# Patient Record
Sex: Female | Born: 1960 | Race: White | Hispanic: No | Marital: Married | Smoking: Current every day smoker
Health system: Southern US, Community
[De-identification: ages and names within clinical notes are randomized; demographics above are authoritative.]

## PROBLEM LIST (undated history)

## (undated) DIAGNOSIS — R32 Unspecified urinary incontinence: Secondary | ICD-10-CM

## (undated) DIAGNOSIS — F419 Anxiety disorder, unspecified: Secondary | ICD-10-CM

## (undated) DIAGNOSIS — E7800 Pure hypercholesterolemia, unspecified: Secondary | ICD-10-CM

## (undated) DIAGNOSIS — C801 Malignant (primary) neoplasm, unspecified: Secondary | ICD-10-CM

## (undated) DIAGNOSIS — J449 Chronic obstructive pulmonary disease, unspecified: Secondary | ICD-10-CM

## (undated) HISTORY — DX: Anxiety disorder, unspecified: F41.9

## (undated) HISTORY — PX: ABDOMINAL HYSTERECTOMY: SHX81

## (undated) HISTORY — DX: Unspecified urinary incontinence: R32

---

## 2008-02-14 ENCOUNTER — Emergency Department: Payer: Self-pay | Admitting: Unknown Physician Specialty

## 2011-04-23 ENCOUNTER — Ambulatory Visit: Payer: Self-pay

## 2011-08-28 DIAGNOSIS — J441 Chronic obstructive pulmonary disease with (acute) exacerbation: Secondary | ICD-10-CM | POA: Insufficient documentation

## 2011-08-28 DIAGNOSIS — M1990 Unspecified osteoarthritis, unspecified site: Secondary | ICD-10-CM | POA: Insufficient documentation

## 2011-10-07 DIAGNOSIS — E7800 Pure hypercholesterolemia, unspecified: Secondary | ICD-10-CM | POA: Insufficient documentation

## 2011-10-29 ENCOUNTER — Emergency Department: Payer: Self-pay | Admitting: Emergency Medicine

## 2011-12-10 ENCOUNTER — Ambulatory Visit: Payer: Self-pay | Admitting: Gastroenterology

## 2012-01-01 ENCOUNTER — Ambulatory Visit: Payer: Self-pay | Admitting: Specialist

## 2012-02-25 DIAGNOSIS — F411 Generalized anxiety disorder: Secondary | ICD-10-CM | POA: Insufficient documentation

## 2012-05-09 ENCOUNTER — Inpatient Hospital Stay: Payer: Self-pay | Admitting: Internal Medicine

## 2012-05-09 LAB — CBC
HCT: 47.6 % — ABNORMAL HIGH (ref 35.0–47.0)
HGB: 15.8 g/dL (ref 12.0–16.0)
MCH: 29.6 pg (ref 26.0–34.0)
MCHC: 33.2 g/dL (ref 32.0–36.0)
Platelet: 196 10*3/uL (ref 150–440)
RBC: 5.35 10*6/uL — ABNORMAL HIGH (ref 3.80–5.20)

## 2012-05-09 LAB — URINALYSIS, COMPLETE
Protein: 100
RBC,UR: 4 /HPF (ref 0–5)
WBC UR: 5 /HPF (ref 0–5)

## 2012-05-09 LAB — COMPREHENSIVE METABOLIC PANEL
Alkaline Phosphatase: 98 U/L (ref 50–136)
Anion Gap: 11 (ref 7–16)
BUN: 8 mg/dL (ref 7–18)
Bilirubin,Total: 0.7 mg/dL (ref 0.2–1.0)
Calcium, Total: 9.1 mg/dL (ref 8.5–10.1)
Chloride: 99 mmol/L (ref 98–107)
Co2: 26 mmol/L (ref 21–32)
Creatinine: 0.6 mg/dL (ref 0.60–1.30)
EGFR (African American): >60
Glucose: 128 mg/dL — ABNORMAL HIGH (ref 65–99)
Osmolality: 272 (ref 275–301)
SGOT(AST): 22 U/L (ref 15–37)
SGPT (ALT): 13 U/L (ref 12–78)
Sodium: 136 mmol/L (ref 136–145)
Total Protein: 8.1 g/dL (ref 6.4–8.2)

## 2012-05-09 LAB — MAGNESIUM: Magnesium: 1.9 mg/dL

## 2012-05-10 LAB — BASIC METABOLIC PANEL
Anion Gap: 8 (ref 7–16)
Co2: 30 mmol/L (ref 21–32)
EGFR (African American): >60
Glucose: 138 mg/dL — ABNORMAL HIGH (ref 65–99)
Sodium: 135 mmol/L — ABNORMAL LOW (ref 136–145)

## 2012-05-10 LAB — CBC WITH DIFFERENTIAL/PLATELET
Basophil #: 0 10*3/uL (ref 0.0–0.1)
Basophil %: 0.2 %
Eosinophil #: 0 10*3/uL (ref 0.0–0.7)
Eosinophil %: 0 %
Lymphocyte #: 0.6 10*3/uL — ABNORMAL LOW (ref 1.0–3.6)
Lymphocyte %: 5.4 %
MCH: 30 pg (ref 26.0–34.0)
MCHC: 33.6 g/dL (ref 32.0–36.0)
Monocyte %: 1.7 %
Neutrophil #: 9.5 10*3/uL — ABNORMAL HIGH (ref 1.4–6.5)
Platelet: 187 10*3/uL (ref 150–440)
RDW: 14.5 % (ref 11.5–14.5)
WBC: 10.2 10*3/uL (ref 3.6–11.0)

## 2012-05-13 LAB — EXPECTORATED SPUTUM ASSESSMENT W REFEX TO RESP CULTURE

## 2012-05-14 LAB — CULTURE, BLOOD (SINGLE)

## 2012-10-14 ENCOUNTER — Emergency Department: Payer: Self-pay | Admitting: Emergency Medicine

## 2012-10-14 LAB — CBC
HCT: 43.7 % (ref 35.0–47.0)
HGB: 14.8 g/dL (ref 12.0–16.0)
MCH: 29.9 pg (ref 26.0–34.0)
MCHC: 33.9 g/dL (ref 32.0–36.0)
MCV: 88 fL (ref 80–100)
Platelet: 194 10*3/uL (ref 150–440)

## 2012-10-14 LAB — BASIC METABOLIC PANEL
Anion Gap: 3 — ABNORMAL LOW (ref 7–16)
BUN: 5 mg/dL — ABNORMAL LOW (ref 7–18)
Calcium, Total: 9.2 mg/dL (ref 8.5–10.1)
Creatinine: 0.7 mg/dL (ref 0.60–1.30)
EGFR (African American): >60
EGFR (Non-African Amer.): >60
Glucose: 113 mg/dL — ABNORMAL HIGH (ref 65–99)
Osmolality: 283 (ref 275–301)
Potassium: 3.5 mmol/L (ref 3.5–5.1)

## 2012-11-02 DIAGNOSIS — Z720 Tobacco use: Secondary | ICD-10-CM | POA: Insufficient documentation

## 2012-11-13 ENCOUNTER — Emergency Department: Payer: Self-pay | Admitting: Emergency Medicine

## 2012-11-13 LAB — BASIC METABOLIC PANEL
Anion Gap: 5 — ABNORMAL LOW (ref 7–16)
BUN: 10 mg/dL (ref 7–18)
Calcium, Total: 8.8 mg/dL (ref 8.5–10.1)
Co2: 32 mmol/L (ref 21–32)
Creatinine: 0.8 mg/dL (ref 0.60–1.30)
Glucose: 154 mg/dL — ABNORMAL HIGH (ref 65–99)
Potassium: 4.1 mmol/L (ref 3.5–5.1)

## 2012-11-13 LAB — CBC
HCT: 45.6 % (ref 35.0–47.0)
HGB: 15.4 g/dL (ref 12.0–16.0)
MCH: 30.4 pg (ref 26.0–34.0)
RBC: 5.05 10*6/uL (ref 3.80–5.20)
RDW: 14.3 % (ref 11.5–14.5)
WBC: 18.3 10*3/uL — ABNORMAL HIGH (ref 3.6–11.0)

## 2013-06-14 ENCOUNTER — Ambulatory Visit: Payer: Self-pay | Admitting: Internal Medicine

## 2013-06-27 ENCOUNTER — Ambulatory Visit: Payer: Self-pay | Admitting: Internal Medicine

## 2013-07-17 ENCOUNTER — Ambulatory Visit: Payer: Self-pay | Admitting: Internal Medicine

## 2013-07-21 ENCOUNTER — Ambulatory Visit: Payer: Self-pay | Admitting: Internal Medicine

## 2013-07-31 ENCOUNTER — Ambulatory Visit: Payer: Self-pay | Admitting: Internal Medicine

## 2013-08-14 ENCOUNTER — Ambulatory Visit: Payer: Self-pay | Admitting: Internal Medicine

## 2013-08-17 LAB — BRONCHIAL WASH CULTURE

## 2013-09-04 LAB — CULTURE, FUNGUS WITHOUT SMEAR

## 2013-09-20 ENCOUNTER — Ambulatory Visit: Payer: Self-pay | Admitting: Internal Medicine

## 2013-12-26 ENCOUNTER — Ambulatory Visit: Payer: Self-pay | Admitting: Internal Medicine

## 2014-04-20 ENCOUNTER — Ambulatory Visit: Payer: Self-pay | Admitting: Internal Medicine

## 2014-06-12 ENCOUNTER — Encounter
Admit: 2014-06-12 | Disposition: A | Discharge status: Home / Self Care | Payer: Self-pay | Attending: Internal Medicine | Admitting: Internal Medicine

## 2014-07-13 ENCOUNTER — Encounter
Admit: 2014-07-13 | Disposition: A | Discharge status: Home / Self Care | Payer: Self-pay | Attending: Internal Medicine | Admitting: Internal Medicine

## 2014-07-19 ENCOUNTER — Ambulatory Visit: Admit: 2014-07-19 | Disposition: A | Discharge status: Home / Self Care | Payer: Self-pay | Admitting: Internal Medicine

## 2014-08-03 NOTE — H&P (Signed)
PATIENT NAME:  Patty Coleman, Patty Coleman MR#:  [id number] DATE OF BIRTH:  [birth year]  DATE OF ADMISSION:  05/09/2012  PRIMARY PHYSICIAN:  Dr. Legrand Como Blocker.  REFERRING PHYSICIAN:  Dr. Michel Santee.  CHIEF COMPLAINT: Cough with sputum and shortness of breath.   HISTORY OF PRESENTING ILLNESS: This is a 54 year old female who is an active smoker and has COPD. She has complaint of cough and sputum for the last 1 to 2 days, and she started feeling short of breath since yesterday afternoon. She was feeling very short of breath and went to her primary care physician's office, Dr. Fabio Asa office, and after seeing her, they told her to go to the Emergency Room immediately for getting admitted. On further workup in Emergency Room, she was found having right middle lobe pneumonia on chest x-ray, and she was severely hypoxic. As per the ER record, her oxygen saturation was 66 on room air, and then they started her on oxygen supplementation by NRBM. Her ABG was severely acidotic, and so she was started on BiPAP as a trial, and she had little improvement on BiPAP, so she is continued on BiPAP and given full admission. On further questioning, she gives answers as following:   REVIEW OF SYSTEMS:   CONSTITUTIONAL:  Negative for fever, fatigue. Positive for generalized weakness.  EYES: No blurring or double vision, pain or redness.  EAR, NOSE, THROAT: No tinnitus, ear pain or hearing loss.  RESPIRATORY: Has cough and greenish sputum and has severe shortness of breath.  CARDIOVASCULAR: No chest pain, orthopnea, arrhythmia or palpitations.  GASTROINTESTINAL: No nausea, vomiting, diarrhea or abdominal pain.  GENITOURINARY: No dysuria, hematuria, frequency or incontinence.  MUSCULOSKELETAL: No pain in back, neck or any other joint.  SKIN: No rashes.  NEUROLOGICAL: No numbness, weakness, tremors. Has some headache because of cough and shortness of breath.  PSYCHIATRIC: No anxiety, insomnia or any other illness.   PAST  MEDICAL HISTORY:  COPD diagnosed since 1 year. Does not use any oxygen at home, and is controlled with inhalers. Hyperlipidemia.   PAST SURGICAL HISTORY: Hysterectomy and C-section.   HOME MEDICATIONS:  Advair 1 puff 2 times a day, ProAir 1 puff every 6 hours as needed, lovastatin, does not remember the dose; tramadol for pain as needed, Xanax 0.5 mg 2 times a day.   SOCIAL HISTORY: She is a smoker, smokes 1 pack per day every day, and has desire to cut it down, has been using nicotine patch for the last few days, but she was still smoking. Drinks alcohol very rarely, almost once a year in social gatherings.  Denies any illegal drug use. She works in a school.   FAMILY HISTORY: Positive for CHF in mother and cousin had diabetes.   ALLERGIES: No known drug allergies.   PHYSICAL EXAMINATION: VITAL SIGNS: Temperature 98.9, pulse rate 110, respirations 33, blood pressure 105/59, and pulse ox on presentation 66 on room air, and currently 96 on BiPAP.  GENERAL: She is fully alert, oriented to time, place and person, and in acute respiratory distress due to shortness of breath and being on BiPAP.  HEAD AND NECK:  Atraumatic. Conjunctivae pink. Oral mucosa moist. No JVD. Neck supple.  RESPIRATORY: Bilateral equal air entry, wheezing and creps are bilateral.   CARDIOVASCULAR: S1, S2 present. Tachycardia. No murmur appreciated, due to tachycardia. Nontender on local palpation on chest.  ABDOMEN: Soft, nontender with bowel sounds present. No organomegaly appreciated.  SKIN: No rashes.  EXTREMITIES: No edema on the lower limbs.  JOINTS: No  swelling or tenderness.  NEUROLOGICAL: Moves all 4 limbs. Follows commands. Power 5/5. No tremors.  PSYCHIATRIC: Does not appear in any acute psychiatric illness at this point of time.   LABORATORY RESULTS:  Glucose 128, BUN 8, creatinine 0.6, sodium 136, potassium 3.7, chloride 99, CO2 of 26, calcium 9.1. Total protein 8.1, albumin 3.9, bilirubin 0.7, alkaline phos  98, SGOT 22, SGPT 13. Troponin less than 0.02. WBC 17.6, hemoglobin 15.8, platelet count 196, MCV 89. Influenza A and B negative antigens. ABG on nonrebreather mask: PH is 7.18, pCO2 of 77 and pO2 of 153 on 100% FiO2, and repeated after 1 hour on BiPAP, pH 7.24, pCO2 of 64, and pO2 of  276 on 80% FiO2.   EKG: Sinus tachycardia, and there were a few runs of premature ventricular beats, up to 8 to 10 beats in a row, and then she converted back to sinus rhythm.  Chest x-ray: Atelectasis versus infiltrate in region of the right middle lobe.   ASSESSMENT AND PLAN: A 54 year old female with past medical history of chronic obstructive pulmonary disease, chronic smoker, and hyperlipidemia, presented with severe shortness of breath and cough with sputum production, sent from PMD's office for admission.  1.  Acute on chronic respiratory failure, severe hypoxia secondary to pneumonia and chronic obstructive pulmonary disease exacerbation. Currently had some improvement on BiPAP, so we will continue BiPAP for the next few hours and check for improvement. If she continues to breath this fast and her CO2 levels remain high and she remains acidotic, then she might need to get intubated. I explained to her about this possibility, and she and her husband are in the room. They are okay with that.  2.  Pneumonia.  We will treat her currently with IV Rocephin and IV Zithromax and monitor for improvement.  3.  Chronic obstructive pulmonary disease exacerbation. The cause of this is pneumonia. We are treating pneumonia with antibiotics. We will give IV steroids. We will give nebulizers, Spiriva and she is currently on BiPAP for that.  4.  Hyperlipidemia. Continue lovastatin.  5.  Premature ventricular beats and tachycardia. This is secondary to respiratory distress and pneumonia, and she had 8 to 10 beats in a row, and then she got converted back to sinus tach, so I attribute this to the stress of shortness of breath and  pneumonia, and I will monitor her on telemetry. I do not think she needs to have any additional medication at this point of time, but if she worsens, then we might need to consider that seriously. 6.  Deep vein thrombosis and gastrointestinal prophylaxis.  7.  Current smoker.  We will give nicotine patch while she is in the hospital. Smoking cessation counseling done for 5 minutes, and she is willing to cut it down and stop it.   CODE STATUS: FULL CODE.   Condition and plan discussed in the presence of husband, who is present in the room.  Total Time Spent on this patient with history taking, evaluation and explaining the plan is 1  hour.   Explained to her and her husband about the critical condition and the possibility of worsening, then she might need to be intubated. They understand and agree with the plan.   ____________________________ Ceasar Lund. Anselm Jungling, MD vgv:dm D: 05/09/2012 12:17:12 ET T: 05/09/2012 12:31:46 ET JOB#: [id number]  cc: Ceasar Lund. Anselm Jungling, MD, <Dictator> Vaughan Basta MD ELECTRONICALLY SIGNED 06/03/2012 17:55

## 2014-08-03 NOTE — Discharge Summary (Signed)
PATIENT NAME:  Patty Coleman, Patty Coleman MR#:  [id number] DATE OF BIRTH:  07/24/1960  DATE OF ADMISSION:  05/09/2012 DATE OF DISCHARGE:  05/12/2012  PRIMARY CARE PHYSICIAN: Patty Boast, MD  PRESENTING COMPLAINT: Shortness of breath.   DISCHARGE DIAGNOSES: 1. Acute on chronic hypoxic respiratory failure due to chronic obstructive pulmonary disease flare.  2. Pneumonia.  3. Tobacco abuse.   SATURATIONS: 93% to 94% on 2 liters nasal cannula continuous.   CODE STATUS: FULL CODE.   DISCHARGE MEDICATIONS: 1. Lovastatin 20 mg daily.  2. Advair 100/50 1 puff 2 times a day. 3. Pro Air HFA 1 puff as needed for shortness of breath.  4. Prednisone taper.  5. Tessalon Perles 100 mg q. 4 hours.  6. Xanax 0.5 mg 2 times a day. 7. Albuterol ipratropium/DuoNebs 3 times daily as needed.  8. Levaquin 500 mg daily.  DISCHARGE INSTRUCTIONS: Follow up with Dr. Clayborn Coleman in 1 to 2 weeks. Use your oxygen and nebulizer as instructed.   DISCHARGE LABS/RADIOLOGIC STUDIES: Legionella antigen was negative. Sputum culture no organisms seen. White count is 10.2. Basic metabolic panel within normal limits. PaCO2 on admission was elevated to 68. Urine negative for UTI. Blood cultures negative in 48 hours.  ABG: On admission pH was 7.18, pCO2 77 and pO2 153. This was on nonrebreather. Influenza A and B were negative.   Chest x-ray consistent with atelectasis versus infiltrate in the region of the right middle lobe.   BRIEF SUMMARY OF HOSPITAL COURSE: Ms. Patty Coleman is a 54 year old Caucasian female with long-standing history of tobacco abuse, COPD and chronic smoker who comes in with severe shortness of breath and cough with sputum production. She was admitted with:  1. Acute on chronic hypoxic hypercarbic respiratory failure secondary to right middle lobe pneumonia and chronic obstructive pulmonary disease exacerbation. She was started on BiPAP initially, changed to nasal cannula. Her sats remained stable. She was on  IV Rocephin and Zithromax which was changed to p.o. Levaquin at discharge. She was given IV Solu-Medrol, high doses, Advair and nebulizers and was changed to p.o. steroids at discharge.  2. Pneumonia. The patient will get treatment with broad-spectrum antibiotics for about 7 to 8 days. Blood cultures remained negative.  3. Chronic obstructive pulmonary disease exacerbation. The patient was continued on steroids, nebulizers and Spiriva. The patient did require home oxygen given her sats dropping down into the 80s on exertion on room air.  4. Hyperlipidemia. Lovastatin was continued.  5. Deep vein thrombosis prophylaxis heparin was with sub-Q heparin. The patient was ambulatory as well.  6. Current tobacco abuse. The patient was given nicotine patch. She was counseled on smoking cessation.   Her hospital stay remained stable. The patient remained a FULL CODE.   TIME SPENT: 40 minutes.  ____________________________ Patty Rochester Posey Pronto, MD sap:sb D: 05/13/2012 07:24:42 ET T: 05/13/2012 11:45:41 ET JOB#: [id number]  cc: Patty Klas A. Posey Pronto, MD, <Dictator> Patty Blocker, MD Patty Basset MD ELECTRONICALLY SIGNED 05/20/2012 7:05

## 2014-08-13 ENCOUNTER — Ambulatory Visit: Payer: Self-pay

## 2014-08-15 ENCOUNTER — Ambulatory Visit: Payer: Self-pay

## 2014-08-17 ENCOUNTER — Ambulatory Visit: Payer: Self-pay

## 2014-08-20 ENCOUNTER — Ambulatory Visit: Payer: Self-pay

## 2014-08-22 ENCOUNTER — Ambulatory Visit: Payer: Self-pay

## 2014-08-22 IMAGING — CT CT CHEST W/ CM
2 of 3 series · 15 of 36 positions shown, 18 images · IV contrast (agent unspecified)
Comparison: Chest CT dated 07/21/2013

CLINICAL DATA: Hx of cervical cancer, f/u ct of chest

EXAM:
CT CHEST WITH CONTRAST
TECHNIQUE: Multidetector CT imaging of the chest was performed during
intravenous contrast administration.
CONTRAST:  75 mL [id number]

[Series 2: routine chest with · axial · 0.60mm/px · z∈[-688,-434]mm · 12 of 61 slices shown, 15 images]
[im 5/61  mediastinal]
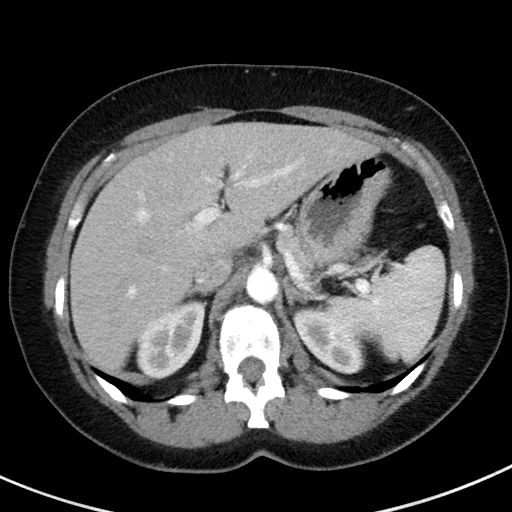
[im 5/61  lung]
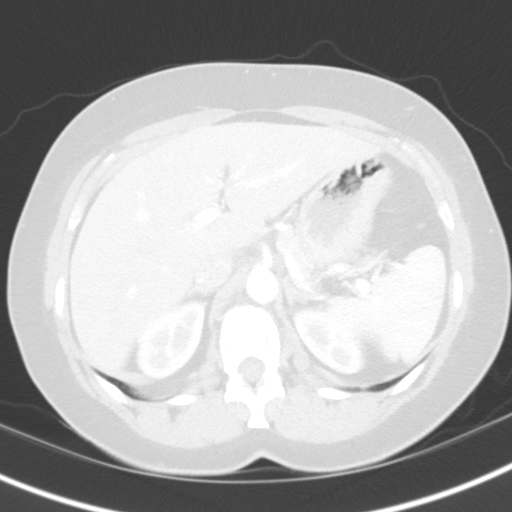
[im 9/61  lung]
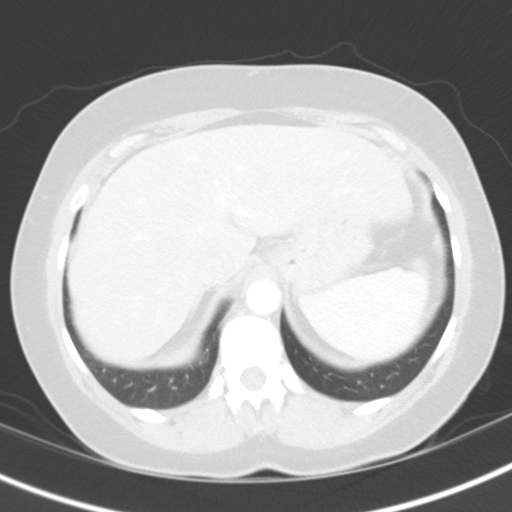
[im 14/61  lung]
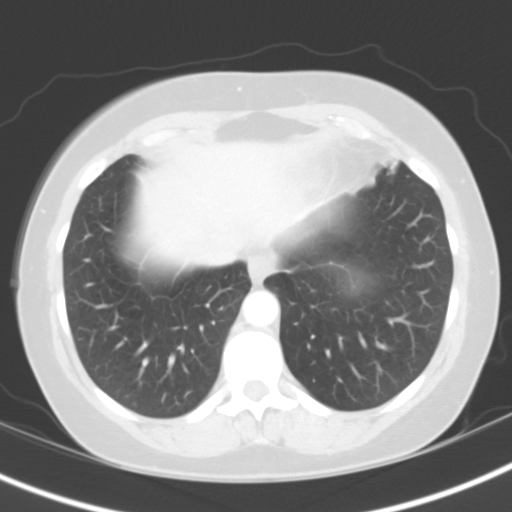
[im 18/61  lung]
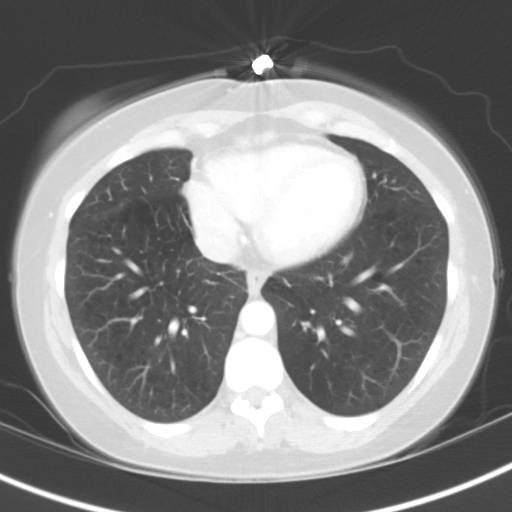
[im 23/61  mediastinal]
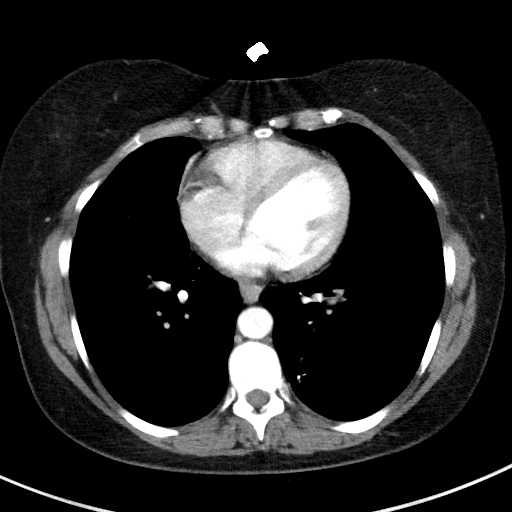
[im 23/61  lung]
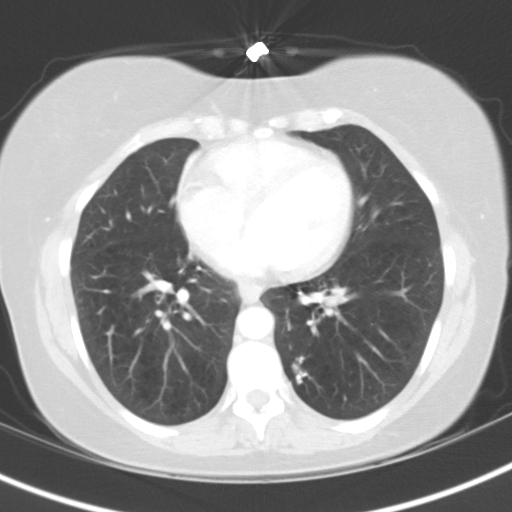
[im 27/61  lung]
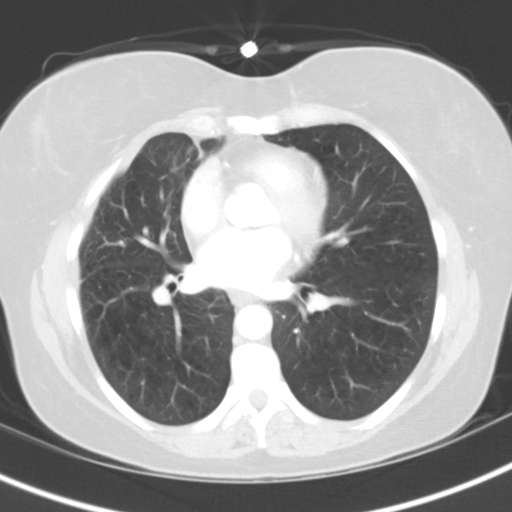
[im 34/61  lung]
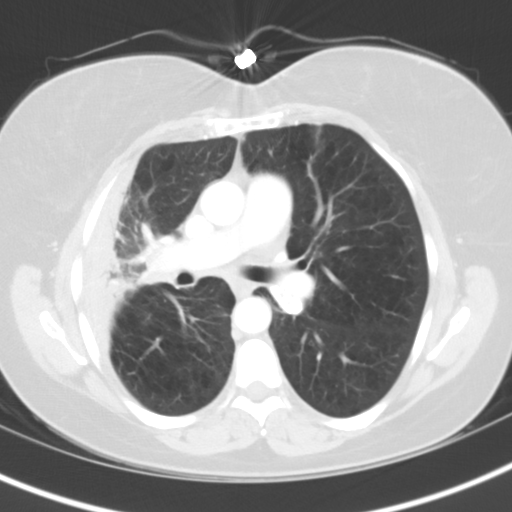
[im 38/61  lung]
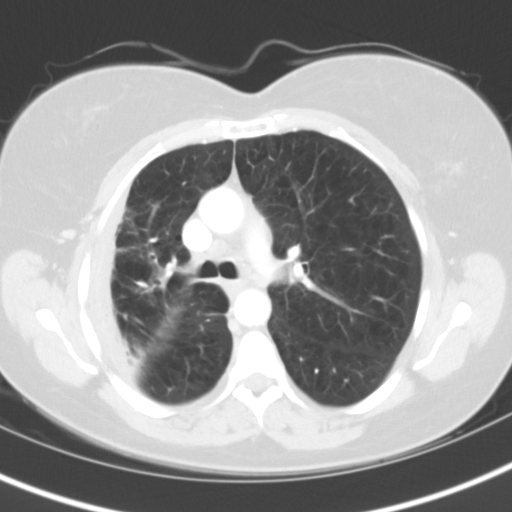
[im 43/61  mediastinal]
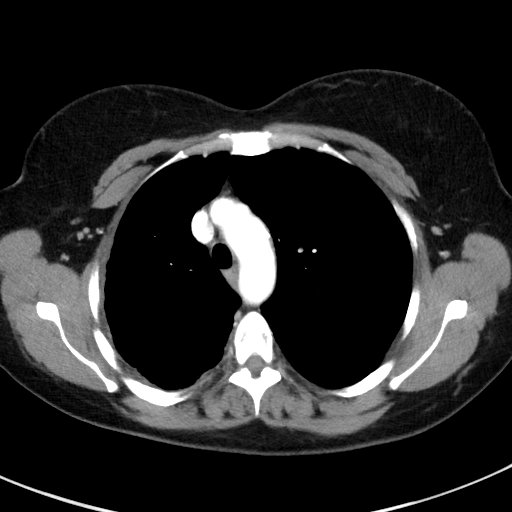
[im 43/61  lung]
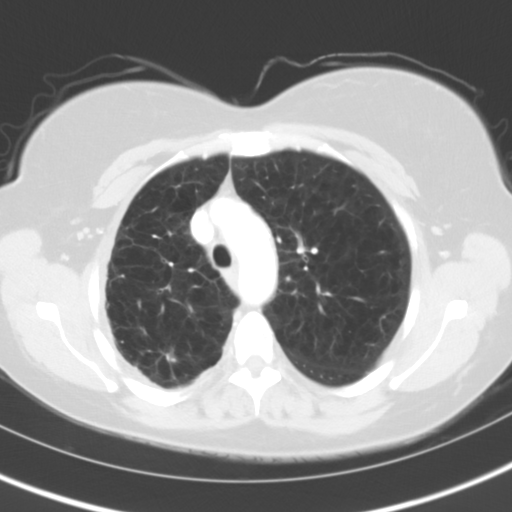
[im 47/61  lung]
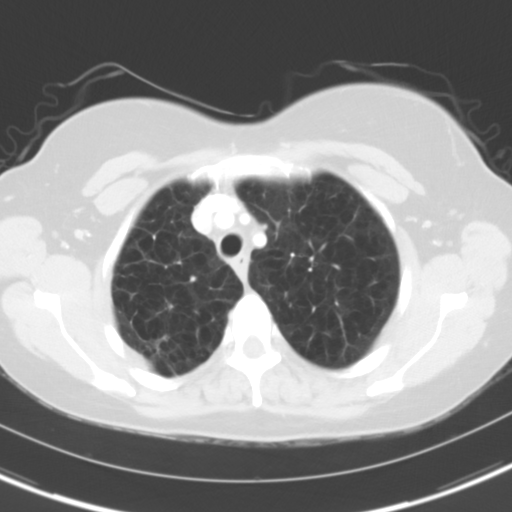
[im 52/61  lung]
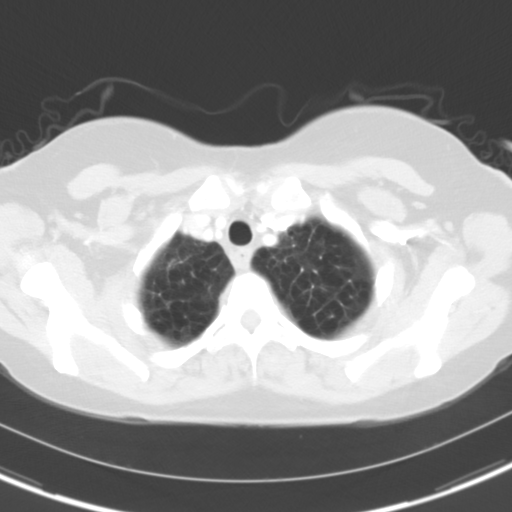
[im 56/61  lung]
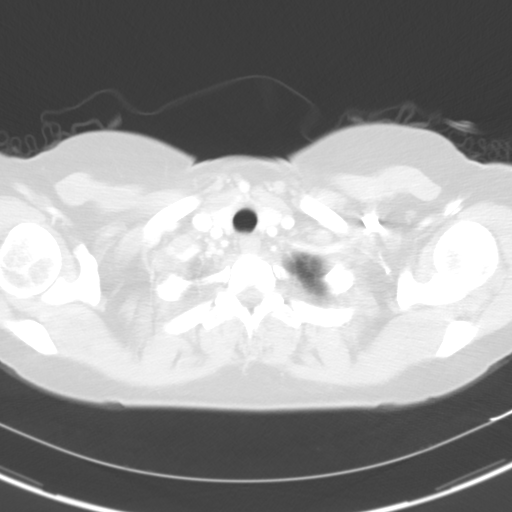

[Series 5: cor routine chest with · coronal · 0.60mm/px · 3 of 148 slices shown]
[im 30/148  lung]
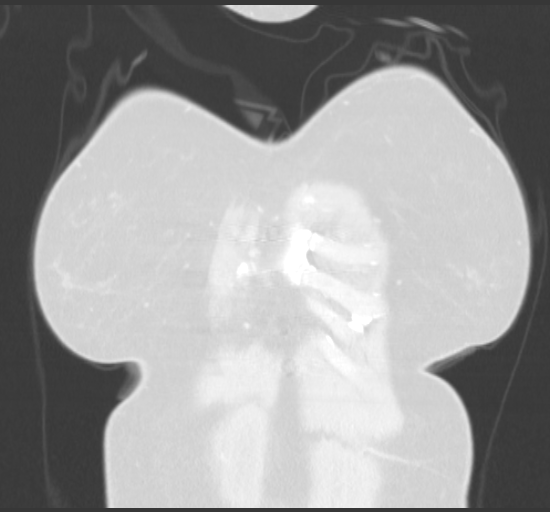
[im 59/148  lung]
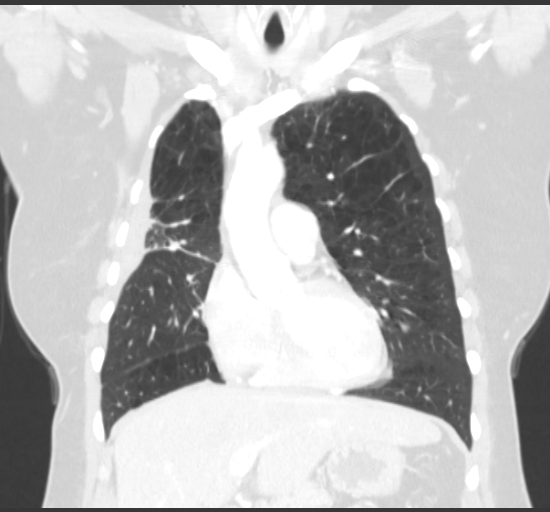
[im 89/148  lung]
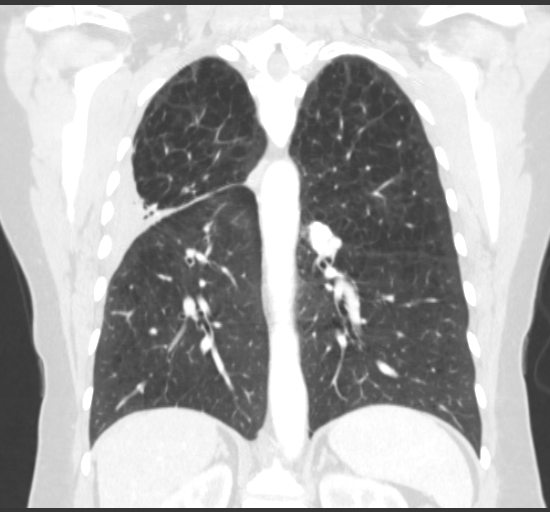

[15 of 36 positions shown; findings below may reference images not displayed]

FINDINGS: The thoracic inlet is unremarkable.

Stable calcified lymph nodes in left hilum. No evidence of
mediastinal adenopathy. The prominent lymph nodes within the
mediastinum otherwise stable. No mediastinal masses.

The consolidative density with air bronchograms in the axillary
segment right upper lobe has slightly decreased when compared to
previous study. No appreciable central hilar or mediastinal masses.

No new focal regions of consolidation or focal infiltrates.

The emphysematous changes in the lungs is stable.

Central airways are patent.

Visualized upper abdominal viscera are stable.

No aggressive appearing osseous lesions.
IMPRESSION: Decrease conspicuity of the right upper lobe consolidation. No
central obstructing mass is appreciated.

Stable emphysematous changes. Stable consistent with prior
granulomatous disease.

Findings again consistent with hepatic steatosis and otherwise
stable upper abdominal viscera.

## 2014-08-24 ENCOUNTER — Ambulatory Visit: Payer: Self-pay

## 2014-08-27 ENCOUNTER — Telehealth: Payer: Self-pay

## 2014-08-27 ENCOUNTER — Ambulatory Visit: Payer: Self-pay

## 2014-08-27 NOTE — Telephone Encounter (Signed)
Patient has been feeling better, on trip to [place] for birthday, is going to return next Monday.

## 2014-08-29 ENCOUNTER — Ambulatory Visit: Payer: Self-pay

## 2014-08-31 ENCOUNTER — Ambulatory Visit: Payer: Self-pay

## 2014-09-03 ENCOUNTER — Ambulatory Visit: Payer: Self-pay

## 2014-09-03 ENCOUNTER — Encounter: Payer: Self-pay | Admitting: *Deleted

## 2014-09-03 DIAGNOSIS — J449 Chronic obstructive pulmonary disease, unspecified: Secondary | ICD-10-CM

## 2014-09-03 NOTE — Progress Notes (Signed)
Pulmonary Individual Treatment Plan  Patient Details  Name: Patty Coleman MRN: [id number] Date of Birth: [birth year] Referring Provider:  Dr.Fozia Humphrey Rolls Initial Encounter Date:    Visit Diagnosis: COPD Patient's Home Medications on Admission: No current outpatient prescriptions on file.  Past Medical History: No past medical history on file.  Tobacco Use: History  Smoking status  . Not on file  Smokeless tobacco  . Not on file    Labs: Recent Review Flowsheet Data    There is no flowsheet data to display.       ADL [facility]:    Pulmonary Function Assessment:   Exercise Target Goals:    Exercise Program Goal: Individual exercise prescription set with THRR, safety & activity barriers. Participant demonstrates ability to understand and report RPE using BORG scale, to self-measure pulse accurately, and to acknowledge the importance of the exercise prescription.  Exercise Prescription Goal: Starting with aerobic activity 30 plus minutes a day, 3 days per week for initial exercise prescription. Provide home exercise prescription and guidelines that participant acknowledges understanding prior to discharge.  Activity Barriers & Risk Stratification:   6 Minute Walk:   Initial Exercise Prescription:   Exercise Prescription Changes:     Exercise Prescription Changes      09/03/14 1000 09/03/14 1400         Exercise Review   Progression Yes Yes      Response to Exercise   Blood Pressure (Admit) 124/74 mmHg 124/74 mmHg      Blood Pressure (Exercise) 142/70 mmHg 142/70 mmHg      Blood Pressure (Exit) 120/80 mmHg 120/80 mmHg      Heart Rate (Admit)  91 bpm      Heart Rate (Exercise)  107 bpm      Heart Rate (Exit)  84 bpm      Oxygen Saturation (Admit)  98 %      Oxygen Saturation (Exercise)  95 %      Oxygen Saturation (Exit)  98 %      Rating of Perceived Exertion (Exercise)  13      Perceived Dyspnea (Exercise)  3      Symptoms no no      Duration  Progress to 50 minutes of aerobic without signs/symptoms of physical distress Progress to 50 minutes of aerobic without signs/symptoms of physical distress      Intensity THRR unchanged THRR unchanged      Progression Continue progressive overload as per policy without signs/symptoms or physical distress. Continue progressive overload as per policy without signs/symptoms or physical distress.      Resistance Training   Training Prescription Yes Yes      Weight 2 2      Reps 10-12 10-12      Treadmill   MPH 1.8 1.8      Grade 0 0      Minutes 15 15      Recumbant Bike   Level 2 2      RPM 50 50      Minutes 15 15      NuStep   Level 3 3      Watts 55 55      Minutes 15 15         Discharge Exercise Prescription:    Nutrition:  Target Goals: Understanding of nutrition guidelines, daily intake of sodium 1500mg , cholesterol 200mg , calories 30% from fat and 7% or less from saturated fats, daily to have 5 or more servings of fruits  and vegetables.  Biometrics:    Nutrition Therapy Plan and Nutrition Goals:   Nutrition Discharge: Rate Your Plate Scores:   Psychosocial: Target Goals: Acknowledge presence or absence of depression, maximize coping skills, provide positive support system. Participant is able to verbalize types and ability to use techniques and skills needed for reducing stress and depression.  Initial Review & Psychosocial Screening:   Quality of Life Scores:   PHQ-9:     Recent Review Flowsheet Data    There is no flowsheet data to display.      Psychosocial Evaluation and Intervention:   Psychosocial Re-Evaluation:  Education: Education Goals: Education classes will be provided on a weekly basis, covering required topics. Participant will state understanding/return demonstration of topics presented.  Learning Barriers/Preferences:   Education Topics: Initial Evaluation Education: - Verbal, written and demonstration of respiratory meds,  RPE/PD scales, oximetry and breathing techniques. Instruction on use of nebulizers and MDIs: cleaning and proper use, rinsing mouth with steroid doses and importance of monitoring MDI activations.   General Nutrition Guidelines/Fats and Fiber: -Group instruction provided by verbal, written material, models and posters to present the general guidelines for heart healthy nutrition. Gives an explanation and review of dietary fats and fiber.   Controlling Sodium/Reading Food Labels: -Group verbal and written material supporting the discussion of sodium use in heart healthy nutrition. Review and explanation with models, verbal and written materials for utilization of the food label.   Exercise Physiology & Risk Factors: - Group verbal and written instruction with models to review the exercise physiology of the cardiovascular system and associated critical values. Details cardiovascular disease risk factors and the goals associated with each risk factor.   Aerobic Exercise & Resistance Training: - Gives group verbal and written discussion on the health impact of inactivity. On the components of aerobic and resistive training programs and the benefits of this training and how to safely progress through these programs.   Flexibility, Balance, General Exercise Guidelines: - Provides group verbal and written instruction on the benefits of flexibility and balance training programs. Provides general exercise guidelines with specific guidelines to those with heart or lung disease. Demonstration and skill practice provided.   Stress Management: - Provides group verbal and written instruction about the health risks of elevated stress, cause of high stress, and healthy ways to reduce stress.   Depression: - Provides group verbal and written instruction on the correlation between heart/lung disease and depressed mood, treatment options, and the stigmas associated with seeking treatment.   Exercise &  Equipment Safety: - Individual verbal instruction and demonstration of equipment use and safety with use of the equipment.   Infection Prevention: - Provides verbal and written material to individual with discussion of infection control including proper hand washing and proper equipment cleaning during exercise session.   Falls Prevention: - Provides verbal and written material to individual with discussion of falls prevention and safety.   Diabetes: - Individual verbal and written instruction to review signs/symptoms of diabetes, desired ranges of glucose level fasting, after meals and with exercise. Advice that pre and post exercise glucose checks will be done for 3 sessions at entry of program.   Chronic Lung Diseases: - Group verbal and written instruction to review new updates, new respiratory medications, new advancements in procedures and treatments. Provide informative websites and "800" numbers of self-education.   Lung Procedures: - Group verbal and written instruction to describe testing methods done to diagnose lung disease. Review the outcome of test results. Describe  the treatment choices: Pulmonary Function Tests, ABGs and oximetry.   Energy Conservation: - Provide group verbal and written instruction for methods to conserve energy, plan and organize activities. Instruct on pacing techniques, use of adaptive equipment and posture/positioning to relieve shortness of breath.   Triggers: - Group verbal and written instruction to review types of environmental controls: home humidity, furnaces, filters, dust mite/pet prevention, HEPA vacuums. To discuss weather changes, air quality and the benefits of nasal washing.   Exacerbations: - Group verbal and written instruction to provide: warning signs, infection symptoms, calling MD promptly, preventive modes, and value of vaccinations. Review: effective airway clearance, coughing and/or vibration techniques. Create an Advertising copywriter.   Oxygen: - Individual and group verbal and written instruction on oxygen therapy. Includes supplement oxygen, available portable oxygen systems, continuous and intermittent flow rates, oxygen safety, concentrators, and Medicare reimbursement for oxygen.   Respiratory Medications: - Group verbal and written instruction to review medications for lung disease. Drug class, frequency, complications, importance of spacers, rinsing mouth after steroid MDI's, and proper cleaning methods for nebulizers.   AED/CPR: - Group verbal and written instruction with the use of models to demonstrate the basic use of the AED with the basic ABC's of resuscitation.   Breathing Retraining: - Provides individuals verbal and written instruction on purpose, frequency, and proper technique of diaphragmatic breathing and pursed-lipped breathing. Applies individual practice skills.   Anatomy and Physiology of the Lungs: - Group verbal and written instruction with the use of models to provide basic lung anatomy and physiology related to function, structure and complications of lung disease.   Heart Failure: - Group verbal and written instruction on the basics of heart failure: signs/symptoms, treatments, explanation of ejection fraction, enlarged heart and cardiomyopathy.   Sleep Apnea: - Individual verbal and written instruction to review Obstructive Sleep Apnea. Review of risk factors, methods for diagnosing and types of masks and machines for OSA.   Anxiety: - Provides group, verbal and written instruction on the correlation between heart/lung disease and anxiety, treatment options, and management of anxiety.   Relaxation: - Provides group, verbal and written instruction about the benefits of relaxation for patients with heart/lung disease. Also provides patients with examples of relaxation techniques.   Knowledge Questionnaire Score:   Personal Goals and Risk Factors at  Admission:   Personal Goals and Risk Factors Review:    Personal Goals Discharge:    Comments: 30 day review

## 2014-09-05 ENCOUNTER — Ambulatory Visit: Payer: Self-pay

## 2014-09-07 ENCOUNTER — Ambulatory Visit: Payer: Self-pay

## 2014-09-12 ENCOUNTER — Ambulatory Visit: Payer: Self-pay

## 2014-09-14 ENCOUNTER — Ambulatory Visit: Payer: Self-pay

## 2014-09-17 ENCOUNTER — Ambulatory Visit: Payer: Self-pay

## 2014-09-19 ENCOUNTER — Ambulatory Visit: Payer: Self-pay

## 2014-09-21 ENCOUNTER — Ambulatory Visit: Payer: Self-pay

## 2014-09-24 ENCOUNTER — Ambulatory Visit: Payer: Self-pay

## 2014-09-24 NOTE — Progress Notes (Signed)
Patty Coleman returned call, is having trouble with swelling in her ankles and cannot get her shoes on.   Also has cut down to 1/2 pack cigarettes a day.  Wants to return after she sees vascular physician.

## 2014-09-24 NOTE — Progress Notes (Signed)
Left message on machine.

## 2014-09-26 ENCOUNTER — Ambulatory Visit: Payer: Self-pay

## 2014-09-28 ENCOUNTER — Ambulatory Visit: Payer: Self-pay

## 2014-10-01 ENCOUNTER — Ambulatory Visit: Payer: Self-pay

## 2014-10-01 ENCOUNTER — Encounter: Payer: Self-pay | Admitting: Internal Medicine

## 2014-10-01 DIAGNOSIS — J449 Chronic obstructive pulmonary disease, unspecified: Secondary | ICD-10-CM

## 2014-10-01 NOTE — Progress Notes (Signed)
Pulmonary Individual Treatment Plan  Patient Details  Name: Patty Coleman MRN: [id number] Date of Birth: 04-30-1960 Referring Provider:  Dr Clayborn Bigness  Initial Encounter Date: 06/12/14  Visit Diagnosis: COPD  Patient's Home Medications on Admission: No current outpatient prescriptions on file.  Past Medical History: No past medical history on file.  Tobacco Use: History  Smoking status   Not on file  Smokeless tobacco   Not on file    Labs: Recent Review Flowsheet Data    There is no flowsheet data to display.       ADL [facility]:    Pulmonary Function Assessment:   Exercise Target Goals:    Exercise Program Goal: Individual exercise prescription set with THRR, safety & activity barriers. Participant demonstrates ability to understand and report RPE using BORG scale, to self-measure pulse accurately, and to acknowledge the importance of the exercise prescription.  Exercise Prescription Goal: Starting with aerobic activity 30 plus minutes a day, 3 days per week for initial exercise prescription. Provide home exercise prescription and guidelines that participant acknowledges understanding prior to discharge.  Activity Barriers & Risk Stratification:   6 Minute Walk:   Initial Exercise Prescription:   Exercise Prescription Changes:     Exercise Prescription Changes      09/03/14 1000 09/03/14 1400         Exercise Review   Progression Yes Yes      Response to Exercise   Blood Pressure (Admit) 124/74 mmHg 124/74 mmHg      Blood Pressure (Exercise) 142/70 mmHg 142/70 mmHg      Blood Pressure (Exit) 120/80 mmHg 120/80 mmHg      Heart Rate (Admit)  91 bpm      Heart Rate (Exercise)  107 bpm      Heart Rate (Exit)  84 bpm      Oxygen Saturation (Admit)  98 %      Oxygen Saturation (Exercise)  95 %      Oxygen Saturation (Exit)  98 %      Rating of Perceived Exertion (Exercise)  13      Perceived Dyspnea (Exercise)  3      Symptoms no no      Duration Progress to 50 minutes of aerobic without signs/symptoms of physical distress Progress to 50 minutes of aerobic without signs/symptoms of physical distress      Intensity THRR unchanged THRR unchanged      Progression Continue progressive overload as per policy without signs/symptoms or physical distress. Continue progressive overload as per policy without signs/symptoms or physical distress.      Resistance Training   Training Prescription Yes Yes      Weight 2 2      Reps 10-12 10-12      Treadmill   MPH 1.8 1.8      Grade 0 0      Minutes 15 15      Recumbant Bike   Level 2 2      RPM 50 50      Minutes 15 15      NuStep   Level 3 3      Watts 55 55      Minutes 15 15         Discharge Exercise Prescription (Final Exercise Prescription Changes):     Exercise Prescription Changes - 09/03/14 1400    Exercise Review   Progression Yes   Response to Exercise   Blood Pressure (Admit) 124/74 mmHg  Blood Pressure (Exercise) 142/70 mmHg   Blood Pressure (Exit) 120/80 mmHg   Heart Rate (Admit) 91 bpm   Heart Rate (Exercise) 107 bpm   Heart Rate (Exit) 84 bpm   Oxygen Saturation (Admit) 98 %   Oxygen Saturation (Exercise) 95 %   Oxygen Saturation (Exit) 98 %   Rating of Perceived Exertion (Exercise) 13   Perceived Dyspnea (Exercise) 3   Symptoms no   Duration Progress to 50 minutes of aerobic without signs/symptoms of physical distress   Intensity THRR unchanged   Progression Continue progressive overload as per policy without signs/symptoms or physical distress.   Resistance Training   Training Prescription Yes   Weight 2   Reps 10-12   Treadmill   MPH 1.8   Grade 0   Minutes 15   Recumbant Bike   Level 2   RPM 50   Minutes 15   NuStep   Level 3   Watts 55   Minutes 15       Nutrition:  Target Goals: Understanding of nutrition guidelines, daily intake of sodium 1500mg , cholesterol 200mg , calories 30% from fat and 7% or less from saturated fats,  daily to have 5 or more servings of fruits and vegetables.  Biometrics:    Nutrition Therapy Plan and Nutrition Goals:   Nutrition Discharge: Rate Your Plate Scores:   Psychosocial: Target Goals: Acknowledge presence or absence of depression, maximize coping skills, provide positive support system. Participant is able to verbalize types and ability to use techniques and skills needed for reducing stress and depression.  Initial Review & Psychosocial Screening:   Quality of Life Scores:   PHQ-9:     Recent Review Flowsheet Data    There is no flowsheet data to display.      Psychosocial Evaluation and Intervention:   Psychosocial Re-Evaluation:  Education: Education Goals: Education classes will be provided on a weekly basis, covering required topics. Participant will state understanding/return demonstration of topics presented.  Learning Barriers/Preferences:   Education Topics: Initial Evaluation Education: - Verbal, written and demonstration of respiratory meds, RPE/PD scales, oximetry and breathing techniques. Instruction on use of nebulizers and MDIs: cleaning and proper use, rinsing mouth with steroid doses and importance of monitoring MDI activations.   General Nutrition Guidelines/Fats and Fiber: -Group instruction provided by verbal, written material, models and posters to present the general guidelines for heart healthy nutrition. Gives an explanation and review of dietary fats and fiber.   Controlling Sodium/Reading Food Labels: -Group verbal and written material supporting the discussion of sodium use in heart healthy nutrition. Review and explanation with models, verbal and written materials for utilization of the food label.   Exercise Physiology & Risk Factors: - Group verbal and written instruction with models to review the exercise physiology of the cardiovascular system and associated critical values. Details cardiovascular disease risk factors  and the goals associated with each risk factor.   Aerobic Exercise & Resistance Training: - Gives group verbal and written discussion on the health impact of inactivity. On the components of aerobic and resistive training programs and the benefits of this training and how to safely progress through these programs.   Flexibility, Balance, General Exercise Guidelines: - Provides group verbal and written instruction on the benefits of flexibility and balance training programs. Provides general exercise guidelines with specific guidelines to those with heart or lung disease. Demonstration and skill practice provided.   Stress Management: - Provides group verbal and written instruction about the health risks of elevated  stress, cause of high stress, and healthy ways to reduce stress.   Depression: - Provides group verbal and written instruction on the correlation between heart/lung disease and depressed mood, treatment options, and the stigmas associated with seeking treatment.   Exercise & Equipment Safety: - Individual verbal instruction and demonstration of equipment use and safety with use of the equipment.   Infection Prevention: - Provides verbal and written material to individual with discussion of infection control including proper hand washing and proper equipment cleaning during exercise session.   Falls Prevention: - Provides verbal and written material to individual with discussion of falls prevention and safety.   Diabetes: - Individual verbal and written instruction to review signs/symptoms of diabetes, desired ranges of glucose level fasting, after meals and with exercise. Advice that pre and post exercise glucose checks will be done for 3 sessions at entry of program.   Chronic Lung Diseases: - Group verbal and written instruction to review new updates, new respiratory medications, new advancements in procedures and treatments. Provide informative websites and "800"  numbers of self-education.   Lung Procedures: - Group verbal and written instruction to describe testing methods done to diagnose lung disease. Review the outcome of test results. Describe the treatment choices: Pulmonary Function Tests, ABGs and oximetry.   Energy Conservation: - Provide group verbal and written instruction for methods to conserve energy, plan and organize activities. Instruct on pacing techniques, use of adaptive equipment and posture/positioning to relieve shortness of breath.   Triggers: - Group verbal and written instruction to review types of environmental controls: home humidity, furnaces, filters, dust mite/pet prevention, HEPA vacuums. To discuss weather changes, air quality and the benefits of nasal washing.   Exacerbations: - Group verbal and written instruction to provide: warning signs, infection symptoms, calling MD promptly, preventive modes, and value of vaccinations. Review: effective airway clearance, coughing and/or vibration techniques. Create an Sports administrator.   Oxygen: - Individual and group verbal and written instruction on oxygen therapy. Includes supplement oxygen, available portable oxygen systems, continuous and intermittent flow rates, oxygen safety, concentrators, and Medicare reimbursement for oxygen.   Respiratory Medications: - Group verbal and written instruction to review medications for lung disease. Drug class, frequency, complications, importance of spacers, rinsing mouth after steroid MDI's, and proper cleaning methods for nebulizers.   AED/CPR: - Group verbal and written instruction with the use of models to demonstrate the basic use of the AED with the basic ABC's of resuscitation.   Breathing Retraining: - Provides individuals verbal and written instruction on purpose, frequency, and proper technique of diaphragmatic breathing and pursed-lipped breathing. Applies individual practice skills.   Anatomy and Physiology of the  Lungs: - Group verbal and written instruction with the use of models to provide basic lung anatomy and physiology related to function, structure and complications of lung disease.   Heart Failure: - Group verbal and written instruction on the basics of heart failure: signs/symptoms, treatments, explanation of ejection fraction, enlarged heart and cardiomyopathy.   Sleep Apnea: - Individual verbal and written instruction to review Obstructive Sleep Apnea. Review of risk factors, methods for diagnosing and types of masks and machines for OSA.   Anxiety: - Provides group, verbal and written instruction on the correlation between heart/lung disease and anxiety, treatment options, and management of anxiety.   Relaxation: - Provides group, verbal and written instruction about the benefits of relaxation for patients with heart/lung disease. Also provides patients with examples of relaxation techniques.   Knowledge Questionnaire Score:  Personal Goals and Risk Factors at Admission:   Personal Goals and Risk Factors Review:    Personal Goals Discharge:    Comments: Ms Patty Coleman last attended 07/18/14 after 9 exercise sessions in [place]; she was called on 09/24/14 and states she is having trouble with leg swelling, but does plan to return to [place].

## 2014-10-03 ENCOUNTER — Ambulatory Visit: Payer: Self-pay

## 2014-10-05 ENCOUNTER — Ambulatory Visit: Payer: Self-pay

## 2014-10-08 ENCOUNTER — Ambulatory Visit: Payer: Self-pay

## 2014-10-10 ENCOUNTER — Ambulatory Visit: Payer: Self-pay

## 2014-10-12 ENCOUNTER — Ambulatory Visit: Payer: Self-pay

## 2014-10-17 ENCOUNTER — Ambulatory Visit: Payer: Self-pay

## 2014-10-19 ENCOUNTER — Ambulatory Visit: Payer: Self-pay

## 2014-10-22 ENCOUNTER — Telehealth: Payer: Self-pay

## 2014-10-22 ENCOUNTER — Ambulatory Visit: Payer: Self-pay

## 2014-10-22 NOTE — Telephone Encounter (Signed)
Left message on machine.

## 2014-10-24 ENCOUNTER — Ambulatory Visit: Payer: Self-pay

## 2014-10-26 ENCOUNTER — Ambulatory Visit: Payer: Self-pay

## 2014-10-29 ENCOUNTER — Ambulatory Visit: Payer: Self-pay | Admitting: Respiratory Therapy

## 2014-10-29 NOTE — Progress Notes (Signed)
Pulmonary Individual Treatment Plan  Patient Details  Name: Patty Coleman MRN: [id number] Date of Birth: September 30, 1960 Referring Provider:  Dr Clayborn Bigness  Initial Encounter Date: 06/12/2014  Visit Diagnosis: COPD, moderate  Patient's Home Medications on Admission: No current outpatient prescriptions on file.  Past Medical History: No past medical history on file.  Tobacco Use: History  Smoking status   Not on file  Smokeless tobacco   Not on file    Labs: Recent Review Flowsheet Data    There is no flowsheet data to display.       ADL [facility]:    Pulmonary Function Assessment:   Exercise Target Goals:    Exercise Program Goal: Individual exercise prescription set with THRR, safety & activity barriers. Participant demonstrates ability to understand and report RPE using BORG scale, to self-measure pulse accurately, and to acknowledge the importance of the exercise prescription.  Exercise Prescription Goal: Starting with aerobic activity 30 plus minutes a day, 3 days per week for initial exercise prescription. Provide home exercise prescription and guidelines that participant acknowledges understanding prior to discharge.  Activity Barriers & Risk Stratification:   6 Minute Walk:   Initial Exercise Prescription:   Exercise Prescription Changes:     Exercise Prescription Changes      09/03/14 1000 09/03/14 1400         Exercise Review   Progression Yes Yes      Response to Exercise   Blood Pressure (Admit) 124/74 mmHg 124/74 mmHg      Blood Pressure (Exercise) 142/70 mmHg 142/70 mmHg      Blood Pressure (Exit) 120/80 mmHg 120/80 mmHg      Heart Rate (Admit)  91 bpm      Heart Rate (Exercise)  107 bpm      Heart Rate (Exit)  84 bpm      Oxygen Saturation (Admit)  98 %      Oxygen Saturation (Exercise)  95 %      Oxygen Saturation (Exit)  98 %      Rating of Perceived Exertion (Exercise)  13      Perceived Dyspnea (Exercise)  3      Symptoms  no no      Duration Progress to 50 minutes of aerobic without signs/symptoms of physical distress Progress to 50 minutes of aerobic without signs/symptoms of physical distress      Intensity THRR unchanged THRR unchanged      Progression Continue progressive overload as per policy without signs/symptoms or physical distress. Continue progressive overload as per policy without signs/symptoms or physical distress.      Resistance Training   Training Prescription Yes Yes      Weight 2 2      Reps 10-12 10-12      Treadmill   MPH 1.8 1.8      Grade 0 0      Minutes 15 15      Recumbant Bike   Level 2 2      RPM 50 50      Minutes 15 15      NuStep   Level 3 3      Watts 55 55      Minutes 15 15         Discharge Exercise Prescription (Final Exercise Prescription Changes):     Exercise Prescription Changes - 09/03/14 1400    Exercise Review   Progression Yes   Response to Exercise   Blood Pressure (Admit) 124/74 mmHg  Blood Pressure (Exercise) 142/70 mmHg   Blood Pressure (Exit) 120/80 mmHg   Heart Rate (Admit) 91 bpm   Heart Rate (Exercise) 107 bpm   Heart Rate (Exit) 84 bpm   Oxygen Saturation (Admit) 98 %   Oxygen Saturation (Exercise) 95 %   Oxygen Saturation (Exit) 98 %   Rating of Perceived Exertion (Exercise) 13   Perceived Dyspnea (Exercise) 3   Symptoms no   Duration Progress to 50 minutes of aerobic without signs/symptoms of physical distress   Intensity THRR unchanged   Progression Continue progressive overload as per policy without signs/symptoms or physical distress.   Resistance Training   Training Prescription Yes   Weight 2   Reps 10-12   Treadmill   MPH 1.8   Grade 0   Minutes 15   Recumbant Bike   Level 2   RPM 50   Minutes 15   NuStep   Level 3   Watts 55   Minutes 15       Nutrition:  Target Goals: Understanding of nutrition guidelines, daily intake of sodium 1500mg , cholesterol 200mg , calories 30% from fat and 7% or less from  saturated fats, daily to have 5 or more servings of fruits and vegetables.  Biometrics:    Nutrition Therapy Plan and Nutrition Goals:   Nutrition Discharge: Rate Your Plate Scores:   Psychosocial: Target Goals: Acknowledge presence or absence of depression, maximize coping skills, provide positive support system. Participant is able to verbalize types and ability to use techniques and skills needed for reducing stress and depression.  Initial Review & Psychosocial Screening:   Quality of Life Scores:   PHQ-9:     Recent Review Flowsheet Data    There is no flowsheet data to display.      Psychosocial Evaluation and Intervention:   Psychosocial Re-Evaluation:  Education: Education Goals: Education classes will be provided on a weekly basis, covering required topics. Participant will state understanding/return demonstration of topics presented.  Learning Barriers/Preferences:   Education Topics: Initial Evaluation Education: - Verbal, written and demonstration of respiratory meds, RPE/PD scales, oximetry and breathing techniques. Instruction on use of nebulizers and MDIs: cleaning and proper use, rinsing mouth with steroid doses and importance of monitoring MDI activations.   General Nutrition Guidelines/Fats and Fiber: -Group instruction provided by verbal, written material, models and posters to present the general guidelines for heart healthy nutrition. Gives an explanation and review of dietary fats and fiber.   Controlling Sodium/Reading Food Labels: -Group verbal and written material supporting the discussion of sodium use in heart healthy nutrition. Review and explanation with models, verbal and written materials for utilization of the food label.   Exercise Physiology & Risk Factors: - Group verbal and written instruction with models to review the exercise physiology of the cardiovascular system and associated critical values. Details cardiovascular  disease risk factors and the goals associated with each risk factor.   Aerobic Exercise & Resistance Training: - Gives group verbal and written discussion on the health impact of inactivity. On the components of aerobic and resistive training programs and the benefits of this training and how to safely progress through these programs.   Flexibility, Balance, General Exercise Guidelines: - Provides group verbal and written instruction on the benefits of flexibility and balance training programs. Provides general exercise guidelines with specific guidelines to those with heart or lung disease. Demonstration and skill practice provided.   Stress Management: - Provides group verbal and written instruction about the health risks of elevated  stress, cause of high stress, and healthy ways to reduce stress.   Depression: - Provides group verbal and written instruction on the correlation between heart/lung disease and depressed mood, treatment options, and the stigmas associated with seeking treatment.   Exercise & Equipment Safety: - Individual verbal instruction and demonstration of equipment use and safety with use of the equipment.   Infection Prevention: - Provides verbal and written material to individual with discussion of infection control including proper hand washing and proper equipment cleaning during exercise session.   Falls Prevention: - Provides verbal and written material to individual with discussion of falls prevention and safety.   Diabetes: - Individual verbal and written instruction to review signs/symptoms of diabetes, desired ranges of glucose level fasting, after meals and with exercise. Advice that pre and post exercise glucose checks will be done for 3 sessions at entry of program.   Chronic Lung Diseases: - Group verbal and written instruction to review new updates, new respiratory medications, new advancements in procedures and treatments. Provide informative  websites and "800" numbers of self-education.   Lung Procedures: - Group verbal and written instruction to describe testing methods done to diagnose lung disease. Review the outcome of test results. Describe the treatment choices: Pulmonary Function Tests, ABGs and oximetry.   Energy Conservation: - Provide group verbal and written instruction for methods to conserve energy, plan and organize activities. Instruct on pacing techniques, use of adaptive equipment and posture/positioning to relieve shortness of breath.   Triggers: - Group verbal and written instruction to review types of environmental controls: home humidity, furnaces, filters, dust mite/pet prevention, HEPA vacuums. To discuss weather changes, air quality and the benefits of nasal washing.   Exacerbations: - Group verbal and written instruction to provide: warning signs, infection symptoms, calling MD promptly, preventive modes, and value of vaccinations. Review: effective airway clearance, coughing and/or vibration techniques. Create an Sports administrator.   Oxygen: - Individual and group verbal and written instruction on oxygen therapy. Includes supplement oxygen, available portable oxygen systems, continuous and intermittent flow rates, oxygen safety, concentrators, and Medicare reimbursement for oxygen.   Respiratory Medications: - Group verbal and written instruction to review medications for lung disease. Drug class, frequency, complications, importance of spacers, rinsing mouth after steroid MDI's, and proper cleaning methods for nebulizers.   AED/CPR: - Group verbal and written instruction with the use of models to demonstrate the basic use of the AED with the basic ABC's of resuscitation.   Breathing Retraining: - Provides individuals verbal and written instruction on purpose, frequency, and proper technique of diaphragmatic breathing and pursed-lipped breathing. Applies individual practice skills.   Anatomy and  Physiology of the Lungs: - Group verbal and written instruction with the use of models to provide basic lung anatomy and physiology related to function, structure and complications of lung disease.   Heart Failure: - Group verbal and written instruction on the basics of heart failure: signs/symptoms, treatments, explanation of ejection fraction, enlarged heart and cardiomyopathy.   Sleep Apnea: - Individual verbal and written instruction to review Obstructive Sleep Apnea. Review of risk factors, methods for diagnosing and types of masks and machines for OSA.   Anxiety: - Provides group, verbal and written instruction on the correlation between heart/lung disease and anxiety, treatment options, and management of anxiety.   Relaxation: - Provides group, verbal and written instruction about the benefits of relaxation for patients with heart/lung disease. Also provides patients with examples of relaxation techniques.   Knowledge Questionnaire Score:  Personal Goals and Risk Factors at Admission:   Personal Goals and Risk Factors Review:    Personal Goals Discharge:    Comments: 30 day review; Ms Patty Coleman last attended [facility] on 07/18/2014; she has had medical issues and a trip to [place]; she states she does want to return to [place] soon. 30 day review; Mr Patty Coleman last attended 10/12/2014; his right leg was swollen and red and had a follow-up appointment at the [place] on 10/17/2014; he is also waiting for approval for increased time to complete his [facility] - his expiration day with the [place] was 10/22/2014.

## 2014-10-31 ENCOUNTER — Ambulatory Visit: Payer: Self-pay

## 2014-11-02 ENCOUNTER — Ambulatory Visit: Payer: Self-pay

## 2014-11-05 ENCOUNTER — Ambulatory Visit: Payer: Self-pay

## 2014-11-07 ENCOUNTER — Ambulatory Visit: Payer: Self-pay

## 2014-11-09 ENCOUNTER — Ambulatory Visit: Payer: Self-pay

## 2014-11-23 ENCOUNTER — Telehealth: Payer: Self-pay | Admitting: Respiratory Therapy

## 2014-11-27 ENCOUNTER — Encounter: Payer: Self-pay | Admitting: Internal Medicine

## 2014-11-27 DIAGNOSIS — J449 Chronic obstructive pulmonary disease, unspecified: Secondary | ICD-10-CM

## 2014-11-27 NOTE — Progress Notes (Signed)
Pulmonary Individual Treatment Plan  Patient Details  Name: Patty Coleman MRN: [id number] Date of Birth: 09/30/1960 Referring Provider:  Dr Clayborn Bigness  Initial Encounter Date: Date: 07/18/14  Visit Diagnosis: COPD  Patient's Home Medications on Admission: No current outpatient prescriptions on file.  Past Medical History: No past medical history on file.  Tobacco Use: History  Smoking status   Not on file  Smokeless tobacco   Not on file    Labs: Recent Review Flowsheet Data    There is no flowsheet data to display.       ADL [facility]:     ADL [facility]      06/12/14 0626       ADL [facility]   ADL Phase Entry     SOB Score total 71     Rest 0     Walk 3     Stairs 4     Bath 3     Dress 2     Shop 3         Pulmonary Function Assessment:     Pulmonary Function Assessment - 11/27/14 0629    Pulmonary Function Tests   RV% 190 %   DLCO% 17 %   Initial Spirometry Results   FVC% 59 %   FEV1% 37 %   FEV1/FVC Ratio 52   Post Bronchodilator Spirometry Results   FVC% 65 %   FEV1% 36 %   FEV1/FVC Ratio 45      Exercise Target Goals: Date: 07/18/14  Exercise Program Goal: Individual exercise prescription set with THRR, safety & activity barriers. Participant demonstrates ability to understand and report RPE using BORG scale, to self-measure pulse accurately, and to acknowledge the importance of the exercise prescription.  Exercise Prescription Goal: Starting with aerobic activity 30 plus minutes a day, 3 days per week for initial exercise prescription. Provide home exercise prescription and guidelines that participant acknowledges understanding prior to discharge.  Activity Barriers & Risk Stratification:   6 Minute Walk:     6 Minute Walk      06/12/14 1300       6 Minute Walk   Phase Initial     Distance 1000 feet     Walk Time 6 minutes     Resting HR 78 bpm     Resting BP 130/68 mmHg     Max Ex. HR 127 bpm     Max Ex. BP 144/68  mmHg     RPE 16     Perceived Dyspnea  5        Initial Exercise Prescription:     Initial Exercise Prescription - 11/27/14 0600    Date of Initial Exercise Prescription   Date 07/18/14   Treadmill   MPH 1.6   Grade 0   Minutes 15   Recumbant Bike   Level 3   RPM 50   Minutes 15   NuStep   Level 2   Watts 16   Minutes 15   Resistance Training   Training Prescription Yes   Weight 2   Reps 10-12      Exercise Prescription Changes:     Exercise Prescription Changes      11/27/14 0600           Response to Exercise   Blood Pressure (Admit) 110/62 mmHg  06/20/14       Blood Pressure (Exercise) 128/72 mmHg       Blood Pressure (Exit) 114/72 mmHg  Heart Rate (Admit) 81 bpm       Heart Rate (Exercise) 120 bpm       Heart Rate (Exit) 81 bpm       Oxygen Saturation (Admit) 99 %  2l/m       Oxygen Saturation (Exercise) 94 %       Oxygen Saturation (Exit) 98 %       Rating of Perceived Exertion (Exercise) 13       Perceived Dyspnea (Exercise) 3       Resistance Training   Training Prescription Yes       Weight 1       Reps 10-12       Treadmill   MPH 1.4       Grade 0       Minutes 10       Recumbant Bike   Level 1       RPM 40       Minutes 10       NuStep   Level 2       Watts 40       Minutes 15          Discharge Exercise Prescription (Final Exercise Prescription Changes):     Exercise Prescription Changes - 11/27/14 0600    Response to Exercise   Blood Pressure (Admit) 110/62 mmHg  06/20/14   Blood Pressure (Exercise) 128/72 mmHg   Blood Pressure (Exit) 114/72 mmHg   Heart Rate (Admit) 81 bpm   Heart Rate (Exercise) 120 bpm   Heart Rate (Exit) 81 bpm   Oxygen Saturation (Admit) 99 %  2l/m   Oxygen Saturation (Exercise) 94 %   Oxygen Saturation (Exit) 98 %   Rating of Perceived Exertion (Exercise) 13   Perceived Dyspnea (Exercise) 3   Resistance Training   Training Prescription Yes   Weight 1   Reps 10-12   Treadmill   MPH 1.4    Grade 0   Minutes 10   Recumbant Bike   Level 1   RPM 40   Minutes 10   NuStep   Level 2   Watts 40   Minutes 15       Nutrition:  Target Goals: Understanding of nutrition guidelines, daily intake of sodium 1500mg , cholesterol 200mg , calories 30% from fat and 7% or less from saturated fats, daily to have 5 or more servings of fruits and vegetables.  Biometrics:    Nutrition Therapy Plan and Nutrition Goals:   Nutrition Discharge: Rate Your Plate Scores:   Psychosocial: Target Goals: Acknowledge presence or absence of depression, maximize coping skills, provide positive support system. Participant is able to verbalize types and ability to use techniques and skills needed for reducing stress and depression.  Initial Review & Psychosocial Screening:   Quality of Life Scores:   PHQ-9:     Recent Review Flowsheet Data    There is no flowsheet data to display.      Psychosocial Evaluation and Intervention:   Psychosocial Re-Evaluation:  Education: Education Goals: Education classes will be provided on a weekly basis, covering required topics. Participant will state understanding/return demonstration of topics presented.  Learning Barriers/Preferences:   Education Topics: Initial Evaluation Education: - Verbal, written and demonstration of respiratory meds, RPE/PD scales, oximetry and breathing techniques. Instruction on use of nebulizers and MDIs: cleaning and proper use, rinsing mouth with steroid doses and importance of monitoring MDI activations.   General Nutrition Guidelines/Fats and Fiber: -Group instruction provided by verbal, written  material, models and posters to present the general guidelines for heart healthy nutrition. Gives an explanation and review of dietary fats and fiber.   Controlling Sodium/Reading Food Labels: -Group verbal and written material supporting the discussion of sodium use in heart healthy nutrition. Review and  explanation with models, verbal and written materials for utilization of the food label.   Exercise Physiology & Risk Factors: - Group verbal and written instruction with models to review the exercise physiology of the cardiovascular system and associated critical values. Details cardiovascular disease risk factors and the goals associated with each risk factor.   Aerobic Exercise & Resistance Training: - Gives group verbal and written discussion on the health impact of inactivity. On the components of aerobic and resistive training programs and the benefits of this training and how to safely progress through these programs.   Flexibility, Balance, General Exercise Guidelines: - Provides group verbal and written instruction on the benefits of flexibility and balance training programs. Provides general exercise guidelines with specific guidelines to those with heart or lung disease. Demonstration and skill practice provided.   Stress Management: - Provides group verbal and written instruction about the health risks of elevated stress, cause of high stress, and healthy ways to reduce stress.   Depression: - Provides group verbal and written instruction on the correlation between heart/lung disease and depressed mood, treatment options, and the stigmas associated with seeking treatment.   Exercise & Equipment Safety: - Individual verbal instruction and demonstration of equipment use and safety with use of the equipment.   Infection Prevention: - Provides verbal and written material to individual with discussion of infection control including proper hand washing and proper equipment cleaning during exercise session.   Falls Prevention: - Provides verbal and written material to individual with discussion of falls prevention and safety.   Diabetes: - Individual verbal and written instruction to review signs/symptoms of diabetes, desired ranges of glucose level fasting, after meals and  with exercise. Advice that pre and post exercise glucose checks will be done for 3 sessions at entry of program.   Chronic Lung Diseases: - Group verbal and written instruction to review new updates, new respiratory medications, new advancements in procedures and treatments. Provide informative websites and "800" numbers of self-education.   Lung Procedures: - Group verbal and written instruction to describe testing methods done to diagnose lung disease. Review the outcome of test results. Describe the treatment choices: Pulmonary Function Tests, ABGs and oximetry.   Energy Conservation: - Provide group verbal and written instruction for methods to conserve energy, plan and organize activities. Instruct on pacing techniques, use of adaptive equipment and posture/positioning to relieve shortness of breath.   Triggers: - Group verbal and written instruction to review types of environmental controls: home humidity, furnaces, filters, dust mite/pet prevention, HEPA vacuums. To discuss weather changes, air quality and the benefits of nasal washing.   Exacerbations: - Group verbal and written instruction to provide: warning signs, infection symptoms, calling MD promptly, preventive modes, and value of vaccinations. Review: effective airway clearance, coughing and/or vibration techniques. Create an Sports administrator.   Oxygen: - Individual and group verbal and written instruction on oxygen therapy. Includes supplement oxygen, available portable oxygen systems, continuous and intermittent flow rates, oxygen safety, concentrators, and Medicare reimbursement for oxygen.   Respiratory Medications: - Group verbal and written instruction to review medications for lung disease. Drug class, frequency, complications, importance of spacers, rinsing mouth after steroid MDI's, and proper cleaning methods for nebulizers.   AED/CPR: -  Group verbal and written instruction with the use of models to demonstrate  the basic use of the AED with the basic ABC's of resuscitation.   Breathing Retraining: - Provides individuals verbal and written instruction on purpose, frequency, and proper technique of diaphragmatic breathing and pursed-lipped breathing. Applies individual practice skills.   Anatomy and Physiology of the Lungs: - Group verbal and written instruction with the use of models to provide basic lung anatomy and physiology related to function, structure and complications of lung disease.   Heart Failure: - Group verbal and written instruction on the basics of heart failure: signs/symptoms, treatments, explanation of ejection fraction, enlarged heart and cardiomyopathy.   Sleep Apnea: - Individual verbal and written instruction to review Obstructive Sleep Apnea. Review of risk factors, methods for diagnosing and types of masks and machines for OSA.   Anxiety: - Provides group, verbal and written instruction on the correlation between heart/lung disease and anxiety, treatment options, and management of anxiety.   Relaxation: - Provides group, verbal and written instruction about the benefits of relaxation for patients with heart/lung disease. Also provides patients with examples of relaxation techniques.   Knowledge Questionnaire Score:   Personal Goals and Risk Factors at Admission:   Personal Goals and Risk Factors Review:    Personal Goals Discharge:    Comments: Ms Patty Coleman has been fighting pneumonia for the last 2 months. Ms Patty Coleman has discussed with Dr Patty Coleman about her return to [place], and they agree that cooler weather would be better for her participation in our program. Ms Patty Coleman will be discharged. She has completed 9 sessions.

## 2014-11-28 NOTE — Telephone Encounter (Signed)
NA

## 2014-12-12 ENCOUNTER — Ambulatory Visit: Payer: Medicaid Other | Admitting: Physical Therapy

## 2016-03-04 ENCOUNTER — Other Ambulatory Visit: Payer: Self-pay

## 2016-03-04 ENCOUNTER — Encounter: Payer: Self-pay | Admitting: Emergency Medicine

## 2016-03-04 ENCOUNTER — Inpatient Hospital Stay
Admission: EM | Admit: 2016-03-04 | Discharge: 2016-03-08 | DRG: 190 | Disposition: A | Discharge status: Home / Self Care | Payer: Medicaid Other | Attending: Internal Medicine | Admitting: Internal Medicine

## 2016-03-04 ENCOUNTER — Emergency Department: Payer: Medicaid Other

## 2016-03-04 DIAGNOSIS — Z515 Encounter for palliative care: Secondary | ICD-10-CM | POA: Diagnosis not present

## 2016-03-04 DIAGNOSIS — E7800 Pure hypercholesterolemia, unspecified: Secondary | ICD-10-CM | POA: Diagnosis present

## 2016-03-04 DIAGNOSIS — R0902 Hypoxemia: Secondary | ICD-10-CM

## 2016-03-04 DIAGNOSIS — Z888 Allergy status to other drugs, medicaments and biological substances status: Secondary | ICD-10-CM | POA: Diagnosis not present

## 2016-03-04 DIAGNOSIS — J441 Chronic obstructive pulmonary disease with (acute) exacerbation: Principal | ICD-10-CM | POA: Diagnosis present

## 2016-03-04 DIAGNOSIS — Z9981 Dependence on supplemental oxygen: Secondary | ICD-10-CM | POA: Diagnosis not present

## 2016-03-04 DIAGNOSIS — J189 Pneumonia, unspecified organism: Secondary | ICD-10-CM | POA: Diagnosis present

## 2016-03-04 DIAGNOSIS — Z7189 Other specified counseling: Secondary | ICD-10-CM

## 2016-03-04 DIAGNOSIS — Z91012 Allergy to eggs: Secondary | ICD-10-CM

## 2016-03-04 DIAGNOSIS — J9621 Acute and chronic respiratory failure with hypoxia: Secondary | ICD-10-CM | POA: Diagnosis present

## 2016-03-04 DIAGNOSIS — F17210 Nicotine dependence, cigarettes, uncomplicated: Secondary | ICD-10-CM | POA: Diagnosis present

## 2016-03-04 DIAGNOSIS — R0602 Shortness of breath: Secondary | ICD-10-CM

## 2016-03-04 DIAGNOSIS — Z66 Do not resuscitate: Secondary | ICD-10-CM | POA: Diagnosis present

## 2016-03-04 DIAGNOSIS — J440 Chronic obstructive pulmonary disease with acute lower respiratory infection: Secondary | ICD-10-CM | POA: Diagnosis present

## 2016-03-04 HISTORY — DX: Chronic obstructive pulmonary disease, unspecified: J44.9

## 2016-03-04 HISTORY — DX: Malignant (primary) neoplasm, unspecified: C80.1

## 2016-03-04 HISTORY — DX: Pure hypercholesterolemia, unspecified: E78.00

## 2016-03-04 LAB — CBC WITH DIFFERENTIAL/PLATELET
BASOS ABS: 0.1 10*3/uL (ref 0–0.1)
BASOS PCT: 1 %
EOS ABS: 0.3 10*3/uL (ref 0–0.7)
EOS PCT: 2 %
HCT: 39.2 % (ref 35.0–47.0)
Hemoglobin: 13.2 g/dL (ref 12.0–16.0)
Lymphocytes Relative: 5 %
Lymphs Abs: 0.7 10*3/uL — ABNORMAL LOW (ref 1.0–3.6)
MCH: 28.9 pg (ref 26.0–34.0)
MCHC: 33.6 g/dL (ref 32.0–36.0)
MCV: 86.1 fL (ref 80.0–100.0)
Monocytes Absolute: 0.5 10*3/uL (ref 0.2–0.9)
Monocytes Relative: 4 %
Neutro Abs: 11.3 10*3/uL — ABNORMAL HIGH (ref 1.4–6.5)
Neutrophils Relative %: 88 %
PLATELETS: 195 10*3/uL (ref 150–440)
RBC: 4.55 MIL/uL (ref 3.80–5.20)
RDW: 14.9 % — ABNORMAL HIGH (ref 11.5–14.5)
WBC: 12.8 10*3/uL — AB (ref 3.6–11.0)

## 2016-03-04 LAB — COMPREHENSIVE METABOLIC PANEL
ALK PHOS: 70 U/L (ref 38–126)
ALT: 9 U/L — AB (ref 14–54)
ANION GAP: 9 (ref 5–15)
AST: 19 U/L (ref 15–41)
Albumin: 4.2 g/dL (ref 3.5–5.0)
BUN: 6 mg/dL (ref 6–20)
CALCIUM: 9.4 mg/dL (ref 8.9–10.3)
CO2: 31 mmol/L (ref 22–32)
CREATININE: 0.66 mg/dL (ref 0.44–1.00)
Chloride: 97 mmol/L — ABNORMAL LOW (ref 101–111)
Glucose, Bld: 138 mg/dL — ABNORMAL HIGH (ref 65–99)
Potassium: 3.8 mmol/L (ref 3.5–5.1)
Sodium: 137 mmol/L (ref 135–145)
TOTAL PROTEIN: 7.7 g/dL (ref 6.5–8.1)
Total Bilirubin: 0.6 mg/dL (ref 0.3–1.2)

## 2016-03-04 LAB — PROTIME-INR
INR: 0.96
PROTHROMBIN TIME: 12.8 s (ref 11.4–15.2)

## 2016-03-04 LAB — LACTIC ACID, PLASMA: LACTIC ACID, VENOUS: 1.3 mmol/L (ref 0.5–1.9)

## 2016-03-04 MED ORDER — DEXTROMETHORPHAN POLISTIREX ER 30 MG/5ML PO SUER
30.0000 mg | Freq: Two times a day (BID) | ORAL | Status: DC
  Administered 2016-03-05 – 2016-03-08 (×8): 30 mg via ORAL
  Filled 2016-03-04 (×8): qty 5

## 2016-03-04 MED ORDER — METHYLPREDNISOLONE SODIUM SUCC 125 MG IJ SOLR
125.0000 mg | Freq: Once | INTRAMUSCULAR | Status: AC
  Administered 2016-03-04: 125 mg via INTRAVENOUS
  Filled 2016-03-04: qty 2

## 2016-03-04 MED ORDER — IPRATROPIUM-ALBUTEROL 0.5-2.5 (3) MG/3ML IN SOLN
3.0000 mL | Freq: Once | RESPIRATORY_TRACT | Status: AC
  Administered 2016-03-04: 3 mL via RESPIRATORY_TRACT
  Filled 2016-03-04: qty 3

## 2016-03-04 MED ORDER — DEXTROSE 5 % IV SOLN
1.0000 g | Freq: Once | INTRAVENOUS | Status: AC
  Administered 2016-03-04: 1 g via INTRAVENOUS

## 2016-03-04 MED ORDER — CEFTRIAXONE SODIUM-DEXTROSE 1-3.74 GM-% IV SOLR
INTRAVENOUS | Status: AC
  Filled 2016-03-04: qty 50

## 2016-03-04 MED ORDER — DM-GUAIFENESIN ER 30-600 MG PO TB12: 1.0000 | ORAL_TABLET | Freq: Two times a day (BID) | ORAL | Status: DC

## 2016-03-04 MED ORDER — IPRATROPIUM-ALBUTEROL 0.5-2.5 (3) MG/3ML IN SOLN
RESPIRATORY_TRACT | Status: AC
  Administered 2016-03-04: 3 mL via RESPIRATORY_TRACT
  Filled 2016-03-04: qty 3

## 2016-03-04 MED ORDER — IPRATROPIUM-ALBUTEROL 0.5-2.5 (3) MG/3ML IN SOLN
3.0000 mL | Freq: Once | RESPIRATORY_TRACT | Status: AC
  Administered 2016-03-04: 3 mL via RESPIRATORY_TRACT

## 2016-03-04 MED ORDER — GUAIFENESIN ER 600 MG PO TB12
600.0000 mg | ORAL_TABLET | Freq: Two times a day (BID) | ORAL | Status: DC
  Administered 2016-03-05 – 2016-03-08 (×8): 600 mg via ORAL
  Filled 2016-03-04 (×9): qty 1

## 2016-03-04 MED ORDER — DEXTROSE 5 % IV SOLN
500.0000 mg | Freq: Once | INTRAVENOUS | Status: AC
  Administered 2016-03-04: 500 mg via INTRAVENOUS
  Filled 2016-03-04: qty 500

## 2016-03-04 NOTE — ED Notes (Addendum)
Pt up to the bathroom at 2130 with with one assist on [place] 3L upon getting back to bed o2 sats dropped to 80-84%on 3L via [place] BP and hr increased very SOB. Pt recovery to 99% via [place] on 3L in 34min. Will continue to monitor.  No urine sample obtain pt missed the hat.

## 2016-03-04 NOTE — ED Notes (Signed)
1 unsuccessful IV attempt, pt taken to xray, will be brought to treatment room following xray

## 2016-03-04 NOTE — ED Provider Notes (Signed)
[facility] Emergency Department Provider Note   ____________________________________________   First MD Initiated Contact with Patient 03/04/16 1951     (approximate)  I have reviewed the triage vital signs and the nursing notes.   HISTORY  Chief Complaint Shortness of Breath    HPI Patty Coleman is a 55 y.o. female with a history of COPD. Patient has become very short of breath with a deep wet cough. She reports she cannot bring anything up. This has been getting worse for the last couple days. Patient had a fever 101 today. Patient gets frequent episodes of pneumonia and this is how it usually starts. Patient's using 3 L of oxygen now usually only needs to. She gets short of breath after walking just 10 feet which is less than usual.  Past Medical History:  Diagnosis Date  . COPD (chronic obstructive pulmonary disease) ([place])   . Hypercholesteremia     There are no active problems to display for this patient.   History reviewed. No pertinent surgical history.  Prior to Admission medications   Not on File    Allergies Eggs or egg-derived products and Lincocin [lincomycin hcl]  History reviewed. No pertinent family history.  Social History Social History  Substance Use Topics  . Smoking status: Current Every Day Smoker  . Smokeless tobacco: Never Used  . Alcohol use No    Review of Systems Constitutional:fever/chills Eyes: No visual changes. ENT: No sore throat. Cardiovascular:Chest aches when she coughs Respiratory:  shortness of breath. Gastrointestinal: No abdominal pain.  No nausea, no vomiting.  No diarrhea.  No constipation. Genitourinary: Negative for dysuria. Musculoskeletal: Negative for back pain. Skin: Negative for rash. Neurological: Negative for headaches, focal weakness or numbness.  10-point ROS otherwise negative.  ____________________________________________   PHYSICAL EXAM:  VITAL SIGNS: ED  Triage Vitals  Enc Vitals Group     BP 03/04/16 1935 (!) 155/63     Pulse Rate 03/04/16 1935 (!) 123     Resp 03/04/16 1935 (!) 26     Temp 03/04/16 1935 98.9 F (37.2 C)     Temp Source 03/04/16 1935 Oral     SpO2 03/04/16 1935 97 %     Weight 03/04/16 1936 161 lb (73 kg)     Height 03/04/16 1936 5\' 2"  (1.575 m)     Head Circumference --      Peak Flow --      Pain Score 03/04/16 1936 7     Pain Loc --      Pain Edu? --      Excl. in [place]? --     Constitutional: Alert and oriented. Well appearing and in no acute distress. Eyes: Conjunctivae are normal. PERRL. EOMI. Head: Atraumatic. Nose: No congestion/rhinnorhea. Mouth/Throat: Mucous membranes are moist.  Oropharynx non-erythematous. Neck: No stridor.  Cardiovascular: Normal rate, regular rhythm. Grossly normal heart sounds.  Good peripheral circulation. Respiratory: Normal respiratory effort.  No retractions. Lungs CTAB. Gastrointestinal: Soft and nontender. No distention. No abdominal bruits. No CVA tenderness. Musculoskeletal: Right leg is chronically swollen more on the left. Patient seen Dr. Lysbeth Galas for this  No joint effusions. Neurologic:  Normal speech and language. No gross focal neurologic deficits are appreciated. No gait instability. Skin:  Skin is warm, dry and intact. No rash noted.  ____________________________________________   LABS (all labs ordered are listed, but only abnormal results are displayed)  Labs Reviewed  COMPREHENSIVE METABOLIC PANEL - Abnormal; Notable for the following:  Result Value   Chloride 97 (*)    Glucose, Bld 138 (*)    ALT 9 (*)    All other components within normal limits  CBC WITH DIFFERENTIAL/PLATELET - Abnormal; Notable for the following:    WBC 12.8 (*)    RDW 14.9 (*)    Neutro Abs 11.3 (*)    Lymphs Abs 0.7 (*)    All other components within normal limits  CULTURE, BLOOD (ROUTINE X 2)  CULTURE, BLOOD (ROUTINE X 2)  URINE CULTURE  LACTIC ACID, PLASMA    PROTIME-INR  LACTIC ACID, PLASMA  URINALYSIS COMPLETEWITH MICROSCOPIC ([facility] ONLY)   ____________________________________________  EKG  EKG read and interpreted by me shows sinus tachycardia rate of 118 normal axis there is some T-wave and ST segment minimal depression in the inferior leads. ____________________________________________  RADIOLOGY  Chest x-ray shows what appears to be progression of the scarring in the right side and some atelectasis in the right base. ___Study Result   CLINICAL DATA:  Shortness of breath today. History of pneumonia and COPD, on oxygen.  EXAM: CHEST  2 VIEW  COMPARISON:  CT chest July 19, 2014  FINDINGS: Unchanged RIGHT peripheral lung wedge-like consolidation. Mild bronchitic changes. Increased lung volumes with flattened hemidiaphragms. Apical bullous changes. No pleural effusion. No pneumothorax. Cardiomediastinal silhouette is normal, mildly calcified aortic knob. Soft tissue planes and included osseous structures are normal.  IMPRESSION: COPD.  Persistent wedge-like consolidation RIGHT lung most compatible with scarring though, recommend follow-up CT chest with contrast on nonemergent basis to exclude underlying mass.   Electronically Signed   By: Elon Alas M.D.   On: 03/04/2016 20:02    ________________________________________   PROCEDURES  Procedure(s) performed:   Procedures  Critical Care performed:  ____________________________________________   INITIAL IMPRESSION / ASSESSMENT AND PLAN / ED COURSE  Pertinent labs & imaging results that were available during my care of the patient were reviewed by me and considered in my medical decision making (see chart for details).    Clinical Course    Patient's had to duonebs and Solu-Medrol and antibiotics Still very short of breath and tight we'll admit her with diagnosis of COPD exacerbation  pneumonia ____________________________________________   FINAL CLINICAL IMPRESSION(S) / ED DIAGNOSES  Final diagnoses:  COPD exacerbation ([place])  Hypoxia  Community acquired pneumonia, unspecified laterality      NEW MEDICATIONS STARTED DURING THIS VISIT:  New Prescriptions   No medications on file     Note:  This document was prepared using Dragon voice recognition software and may include unintentional dictation errors.    Nena Polio, MD 03/04/16 2124

## 2016-03-04 NOTE — ED Notes (Signed)
Pt up to the bathroom at this time. Pt lung sounds still diminished and tight. SVN treatments no really improvement. Plan to admit to hospital .

## 2016-03-04 NOTE — ED Triage Notes (Addendum)
Pt to triage via [place], reports SOB with hx of COPD, congested cough in triage, reports hx of pneumonia as well, family states this is how it usually begins.  Pt on 3L oxygen currently, normally use 2.  Pt reports fever at home 101.5, tx with ibuprofen.  Pt with labored breathing in triage.

## 2016-03-04 NOTE — ED Notes (Signed)
Pt just got into room from xray

## 2016-03-04 NOTE — ED Notes (Signed)
ED Provider at bedside. 

## 2016-03-05 LAB — CBC
HEMATOCRIT: 37.6 % (ref 35.0–47.0)
HEMOGLOBIN: 12.2 g/dL (ref 12.0–16.0)
MCH: 28.3 pg (ref 26.0–34.0)
MCHC: 32.5 g/dL (ref 32.0–36.0)
MCV: 87.3 fL (ref 80.0–100.0)
Platelets: 176 10*3/uL (ref 150–440)
RBC: 4.3 MIL/uL (ref 3.80–5.20)
RDW: 14.6 % — ABNORMAL HIGH (ref 11.5–14.5)
WBC: 11.5 10*3/uL — ABNORMAL HIGH (ref 3.6–11.0)

## 2016-03-05 LAB — URINALYSIS COMPLETE WITH MICROSCOPIC (ARMC ONLY)
BILIRUBIN URINE: NEGATIVE
GLUCOSE, UA: NEGATIVE mg/dL
Ketones, ur: NEGATIVE mg/dL
Leukocytes, UA: NEGATIVE
Nitrite: NEGATIVE
Protein, ur: NEGATIVE mg/dL
SPECIFIC GRAVITY, URINE: 1.003 — AB (ref 1.005–1.030)
pH: 6 (ref 5.0–8.0)

## 2016-03-05 LAB — BASIC METABOLIC PANEL
ANION GAP: 6 (ref 5–15)
BUN: 10 mg/dL (ref 6–20)
CHLORIDE: 98 mmol/L — AB (ref 101–111)
CO2: 34 mmol/L — AB (ref 22–32)
Calcium: 9.1 mg/dL (ref 8.9–10.3)
Creatinine, Ser: 0.55 mg/dL (ref 0.44–1.00)
GFR calc Af Amer: >60 mL/min (ref >60)
GFR calc non Af Amer: >60 mL/min (ref >60)
GLUCOSE: 150 mg/dL — AB (ref 65–99)
POTASSIUM: 4.7 mmol/L (ref 3.5–5.1)
Sodium: 138 mmol/L (ref 135–145)

## 2016-03-05 LAB — MAGNESIUM: MAGNESIUM: 1.8 mg/dL (ref 1.7–2.4)

## 2016-03-05 LAB — PHOSPHORUS: PHOSPHORUS: 4.1 mg/dL (ref 2.5–4.6)

## 2016-03-05 MED ORDER — ACETAMINOPHEN 650 MG RE SUPP: 650.0000 mg | Freq: Four times a day (QID) | RECTAL | Status: DC | PRN

## 2016-03-05 MED ORDER — IPRATROPIUM-ALBUTEROL 0.5-2.5 (3) MG/3ML IN SOLN
3.0000 mL | Freq: Four times a day (QID) | RESPIRATORY_TRACT | Status: DC
  Administered 2016-03-05 – 2016-03-07 (×9): 3 mL via RESPIRATORY_TRACT
  Filled 2016-03-05 (×8): qty 3

## 2016-03-05 MED ORDER — METHYLPREDNISOLONE SODIUM SUCC 125 MG IJ SOLR
60.0000 mg | Freq: Four times a day (QID) | INTRAMUSCULAR | Status: DC
  Administered 2016-03-05 – 2016-03-08 (×14): 60 mg via INTRAVENOUS
  Filled 2016-03-05 (×14): qty 2

## 2016-03-05 MED ORDER — GUAIFENESIN ER 600 MG PO TB12
600.0000 mg | ORAL_TABLET | Freq: Two times a day (BID) | ORAL | Status: DC
  Filled 2016-03-05: qty 1

## 2016-03-05 MED ORDER — ENOXAPARIN SODIUM 40 MG/0.4ML ~~LOC~~ SOLN
40.0000 mg | Freq: Every day | SUBCUTANEOUS | Status: DC
  Administered 2016-03-05 – 2016-03-07 (×4): 40 mg via SUBCUTANEOUS
  Filled 2016-03-05 (×4): qty 0.4

## 2016-03-05 MED ORDER — FLUTICASONE FUROATE-VILANTEROL 100-25 MCG/INH IN AEPB
1.0000 | INHALATION_SPRAY | Freq: Every day | RESPIRATORY_TRACT | Status: DC
  Administered 2016-03-05 – 2016-03-08 (×4): 1 via RESPIRATORY_TRACT
  Filled 2016-03-05: qty 28

## 2016-03-05 MED ORDER — ONDANSETRON HCL 4 MG/2ML IJ SOLN: 4.0000 mg | Freq: Four times a day (QID) | INTRAMUSCULAR | Status: DC | PRN

## 2016-03-05 MED ORDER — IPRATROPIUM-ALBUTEROL 20-100 MCG/ACT IN AERS
1.0000 | INHALATION_SPRAY | Freq: Four times a day (QID) | RESPIRATORY_TRACT | Status: DC
  Administered 2016-03-05 – 2016-03-08 (×11): 1 via RESPIRATORY_TRACT
  Filled 2016-03-05: qty 4

## 2016-03-05 MED ORDER — CEFTRIAXONE SODIUM-DEXTROSE 1-3.74 GM-% IV SOLR
1.0000 g | INTRAVENOUS | Status: DC
  Administered 2016-03-05 – 2016-03-07 (×3): 1 g via INTRAVENOUS
  Filled 2016-03-05 (×4): qty 50

## 2016-03-05 MED ORDER — ONDANSETRON HCL 4 MG PO TABS: 4.0000 mg | ORAL_TABLET | Freq: Four times a day (QID) | ORAL | Status: DC | PRN

## 2016-03-05 MED ORDER — ZOLPIDEM TARTRATE 5 MG PO TABS
5.0000 mg | ORAL_TABLET | Freq: Every evening | ORAL | Status: DC | PRN
  Administered 2016-03-07 (×2): 5 mg via ORAL
  Filled 2016-03-05 (×2): qty 1

## 2016-03-05 MED ORDER — NICOTINE 21 MG/24HR TD PT24
21.0000 mg | MEDICATED_PATCH | Freq: Every day | TRANSDERMAL | Status: DC
  Administered 2016-03-05 – 2016-03-08 (×4): 21 mg via TRANSDERMAL
  Filled 2016-03-05 (×4): qty 1

## 2016-03-05 MED ORDER — HYDROCODONE-ACETAMINOPHEN 5-325 MG PO TABS
1.0000 | ORAL_TABLET | ORAL | Status: DC | PRN
  Administered 2016-03-05 – 2016-03-06 (×3): 1 via ORAL
  Administered 2016-03-07: 2 via ORAL
  Filled 2016-03-05: qty 1
  Filled 2016-03-05: qty 2
  Filled 2016-03-05 (×2): qty 1

## 2016-03-05 MED ORDER — SODIUM CHLORIDE 0.9 % IV SOLN
INTRAVENOUS | Status: DC
  Administered 2016-03-05: 02:00:00 via INTRAVENOUS

## 2016-03-05 MED ORDER — ACETAMINOPHEN 325 MG PO TABS: 650.0000 mg | ORAL_TABLET | Freq: Four times a day (QID) | ORAL | Status: DC | PRN

## 2016-03-05 MED ORDER — SODIUM CHLORIDE 0.9% FLUSH
3.0000 mL | Freq: Two times a day (BID) | INTRAVENOUS | Status: DC
  Administered 2016-03-05 – 2016-03-08 (×8): 3 mL via INTRAVENOUS

## 2016-03-05 MED ORDER — IPRATROPIUM-ALBUTEROL 0.5-2.5 (3) MG/3ML IN SOLN: 3.0000 mL | Freq: Four times a day (QID) | RESPIRATORY_TRACT | Status: DC | PRN

## 2016-03-05 MED ORDER — MAGNESIUM CITRATE PO SOLN
1.0000 | Freq: Once | ORAL | Status: DC | PRN
Start: 2016-03-05 — End: 2016-03-08

## 2016-03-05 MED ORDER — BISACODYL 5 MG PO TBEC: 5.0000 mg | DELAYED_RELEASE_TABLET | Freq: Every day | ORAL | Status: DC | PRN

## 2016-03-05 MED ORDER — UMECLIDINIUM BROMIDE 62.5 MCG/INH IN AEPB
1.0000 | INHALATION_SPRAY | Freq: Every day | RESPIRATORY_TRACT | Status: DC
  Administered 2016-03-05 – 2016-03-07 (×3): 1 via RESPIRATORY_TRACT
  Filled 2016-03-05: qty 7

## 2016-03-05 MED ORDER — DEXTROSE 5 % IV SOLN
500.0000 mg | INTRAVENOUS | Status: DC
  Administered 2016-03-05: 500 mg via INTRAVENOUS
  Filled 2016-03-05 (×2): qty 500

## 2016-03-05 MED ORDER — SENNOSIDES-DOCUSATE SODIUM 8.6-50 MG PO TABS: 1.0000 | ORAL_TABLET | Freq: Every evening | ORAL | Status: DC | PRN

## 2016-03-05 NOTE — Progress Notes (Signed)
[place] at [facility]                                                                                                                                                                                  Patient Demographics   Patty Coleman, is a 55 y.o. female, DOB - 03-28-1961, NQ:[id number]  Admit date - 03/04/2016   Admitting Physician Harvie Bridge, DO  Outpatient Primary MD for the patient is Pcp Not In System   LOS - 1  Subjective:Patient states that she is feeling better compared to the admission. Still having a cough. No chest pain. Still has shortness of breath and wheezing     Review of Systems:   CONSTITUTIONAL: No documented fever. No fatigue, weakness. No weight gain, no weight loss.  EYES: No blurry or double vision.  ENT: No tinnitus. No postnasal drip. No redness of the oropharynx.  RESPIRATORY: Positive cough, positive wheeze, no hemoptysis. Positive dyspnea.  CARDIOVASCULAR: No chest pain. No orthopnea. No palpitations. No syncope.  GASTROINTESTINAL: No nausea, no vomiting or diarrhea. No abdominal pain. No melena or hematochezia.  GENITOURINARY: No dysuria or hematuria.  ENDOCRINE: No polyuria or nocturia. No heat or cold intolerance.  HEMATOLOGY: No anemia. No bruising. No bleeding.  INTEGUMENTARY: No rashes. No lesions.  MUSCULOSKELETAL: No arthritis. No swelling. No gout.  NEUROLOGIC: No numbness, tingling, or ataxia. No seizure-type activity.  PSYCHIATRIC: No anxiety. No insomnia. No ADD.    Vitals:   Vitals:   03/05/16 0137 03/05/16 0506 03/05/16 0508 03/05/16 0722  BP: 130/73 (!) 153/135 130/71 104/89  Pulse: 94 81 83 74  Resp:  19  18  Temp: 99.1 F (37.3 C) 97.6 F (36.4 C)  97.5 F (36.4 C)  TempSrc: Oral Oral  Oral  SpO2: 99% 99%  99%  Weight:      Height:        Wt Readings from Last 3 Encounters:  03/04/16 161 lb (73 kg)     Intake/Output Summary (Last 24 hours) at 03/05/16 1223 Last data  filed at 03/05/16 0936  Gross per 24 hour  Intake           441.25 ml  Output              301 ml  Net           140.25 ml    Physical Exam:   GENERAL: Pleasant-appearing in no apparent distress.  HEAD, EYES, EARS, NOSE AND THROAT: Atraumatic, normocephalic. Extraocular muscles are intact. Pupils equal and reactive to light. Sclerae anicteric. No conjunctival injection. No oro-pharyngeal erythema.  NECK: Supple. There is no jugular venous distention. No bruits,  no lymphadenopathy, no thyromegaly.  HEART: Regular rate and rhythm,. No murmurs, no rubs, no clicks.  LUNGS: Bilateral wheezing No rales or rhonchi.  No accesory muscle usage  ABDOMEN: Soft, flat, nontender, nondistended. Has good bowel sounds. No hepatosplenomegaly appreciated.  EXTREMITIES: No evidence of any cyanosis, clubbing, or peripheral edema.  +2 pedal and radial pulses bilaterally.  NEUROLOGIC: The patient is alert, awake, and oriented x3 with no focal motor or sensory deficits appreciated bilaterally.  SKIN: Moist and warm with no rashes appreciated.  Psych: Not anxious, depressed LN: No inguinal LN enlargement    Antibiotics   Anti-infectives    Start     Dose/Rate Route Frequency Ordered Stop   03/05/16 2200  azithromycin (ZITHROMAX) 500 mg in dextrose 5 % 250 mL IVPB     500 mg 250 mL/hr over 60 Minutes Intravenous Every 24 hours 03/05/16 0135     03/05/16 2130  cefTRIAXone (ROCEPHIN) IVPB 1 g     1 g 100 mL/hr over 30 Minutes Intravenous Every 24 hours 03/05/16 0151     03/04/16 2025  cefTRIAXone (ROCEPHIN) 1-3.74 GM-% IVPB    Comments:  BROWN, KIMREY: cabinet override      03/04/16 2025 03/05/16 0829   03/04/16 2015  cefTRIAXone (ROCEPHIN) 1 g in dextrose 5 % 50 mL IVPB     1 g 100 mL/hr over 30 Minutes Intravenous  Once 03/04/16 2004 03/04/16 2100   03/04/16 2015  azithromycin (ZITHROMAX) 500 mg in dextrose 5 % 250 mL IVPB     500 mg 250 mL/hr over 60 Minutes Intravenous  Once 03/04/16 2004 03/04/16  2209      Medications   Scheduled Meds: . azithromycin  500 mg Intravenous Q24H  . cefTRIAXone  1 g Intravenous Q24H  . guaiFENesin  600 mg Oral BID   And  . dextromethorphan  30 mg Oral BID  . enoxaparin (LOVENOX) injection  40 mg Subcutaneous QHS  . fluticasone furoate-vilanterol  1 puff Inhalation Daily  . ipratropium-albuterol  3 mL Inhalation QID  . methylPREDNISolone (SOLU-MEDROL) injection  60 mg Intravenous Q6H  . nicotine  21 mg Transdermal Daily  . sodium chloride flush  3 mL Intravenous Q12H  . umeclidinium bromide  1 puff Inhalation Daily   Continuous Infusions: PRN Meds:.acetaminophen **OR** acetaminophen, bisacodyl, HYDROcodone-acetaminophen, ipratropium-albuterol, magnesium citrate, ondansetron **OR** ondansetron (ZOFRAN) IV, senna-docusate, zolpidem   Data Review:   Micro Results Recent Results (from the past 240 hour(s))  Culture, blood (Routine x 2)     Status: None (Preliminary result)   Collection Time: 03/04/16  8:05 PM  Result Value Ref Range Status   Specimen Description BLOOD LEFT WRIST  Final   Special Requests BOTTLES DRAWN AEROBIC AND ANAEROBIC [place]  Final   Culture NO GROWTH < 12 HOURS  Final   Report Status PENDING  Incomplete  Culture, blood (Routine x 2)     Status: None (Preliminary result)   Collection Time: 03/04/16  8:05 PM  Result Value Ref Range Status   Specimen Description BLOOD RIGHT HAND  Final   Special Requests BOTTLES DRAWN AEROBIC AND ANAEROBIC 14CC  Final   Culture NO GROWTH < 12 HOURS  Final   Report Status PENDING  Incomplete    Radiology Reports Dg Chest 2 View  Result Date: 03/04/2016 CLINICAL DATA:  Shortness of breath today. History of pneumonia and COPD, on oxygen. EXAM: CHEST  2 VIEW COMPARISON:  CT chest July 19, 2014 FINDINGS: Unchanged RIGHT peripheral lung wedge-like consolidation.  Mild bronchitic changes. Increased lung volumes with flattened hemidiaphragms. Apical bullous changes. No pleural effusion. No  pneumothorax. Cardiomediastinal silhouette is normal, mildly calcified aortic knob. Soft tissue planes and included osseous structures are normal. IMPRESSION: COPD. Persistent wedge-like consolidation RIGHT lung most compatible with scarring though, recommend follow-up CT chest with contrast on nonemergent basis to exclude underlying mass. Electronically Signed   By: Elon Alas M.D.   On: 03/04/2016 20:02     CBC  Recent Labs Lab 03/04/16 2004 03/05/16 0627  WBC 12.8* 11.5*  HGB 13.2 12.2  HCT 39.2 37.6  PLT 195 176  MCV 86.1 87.3  MCH 28.9 28.3  MCHC 33.6 32.5  RDW 14.9* 14.6*  LYMPHSABS 0.7*  --   MONOABS 0.5  --   EOSABS 0.3  --   BASOSABS 0.1  --     Chemistries   Recent Labs Lab 03/04/16 2004 03/05/16 0627  NA 137 138  K 3.8 4.7  CL 97* 98*  CO2 31 34*  GLUCOSE 138* 150*  BUN 6 10  CREATININE 0.66 0.55  CALCIUM 9.4 9.1  MG 1.8  --   AST 19  --   ALT 9*  --   ALKPHOS 70  --   BILITOT 0.6  --    ------------------------------------------------------------------------------------------------------------------ estimated creatinine clearance is 74.4 mL/min (by C-G formula based on SCr of 0.55 mg/dL). ------------------------------------------------------------------------------------------------------------------ No results for input(s): HGBA1C in the last 72 hours. ------------------------------------------------------------------------------------------------------------------ No results for input(s): CHOL, HDL, LDLCALC, TRIG, CHOLHDL, LDLDIRECT in the last 72 hours. ------------------------------------------------------------------------------------------------------------------ No results for input(s): TSH, T4TOTAL, T3FREE, THYROIDAB in the last 72 hours.  Invalid input(s): FREET3 ------------------------------------------------------------------------------------------------------------------ No results for input(s): VITAMINB12, FOLATE, FERRITIN,  TIBC, IRON, RETICCTPCT in the last 72 hours.  Coagulation profile  Recent Labs Lab 03/04/16 2004  INR 0.96    No results for input(s): DDIMER in the last 72 hours.  Cardiac Enzymes No results for input(s): CKMB, TROPONINI, MYOGLOBIN in the last 168 hours.  Invalid input(s): CK ------------------------------------------------------------------------------------------------------------------ Invalid input(s): [place]   This is a 55 y.o. female with a history of home O2 dependent COPD and hyperlipidemia now being admitted with: 1. Acute on chronic COPD exacerbation- Continue therapy with azithromycin and ceftriaxone, nebulizer therapy, IV Solu-Medrol Admission to current therapy follow her sputum cultures 2. Likely community-acquired pneumonia given leukocytosis, fever and increased oxygen requirement.  Continue IV antibiotics. Follow her sputum cultures If no improvement will consider CT scan of the chest 3. Tobacco use disorder, severe-continue nicotine patch     Code Status Orders        Start     Ordered   03/05/16 0136  Full code  Continuous     03/05/16 0136    Code Status History    Date Active Date Inactive Code Status Order ID Comments User Context   This patient has a current code status but no historical code status.           Consults  Pulmonary   DVT Prophylaxis  Lovenox  Lab Results  Component Value Date   PLT 176 03/05/2016     Time Spent in minutes  45 minutes Greater than 50% of time spent in care coordination and counseling patient regarding the condition and plan of care.   Dustin Flock M.D on 03/05/2016 at 12:23 PM  Between 7am to 6pm - Pager - [phone]  After 6pm go to [email or web address] - [place] [facility]   Office  [phone]  

## 2016-03-05 NOTE — Progress Notes (Signed)
Pharmacy Antibiotic Note  Calyse Guerrero is a 55 y.o. female admitted on 03/04/2016 with CAP.  Pharmacy has been consulted for ceftriaxone dosing.  Plan: Ceftriaxone 1 gm IV Q24H  Height: 5\' 2"  (157.5 cm) Weight: 161 lb (73 kg) IBW/kg (Calculated) : 50.1  Temp (24hrs), Avg:98.9 F (37.2 C), Min:98.5 F (36.9 C), Max:99.4 F (37.4 C)   Recent Labs Lab 03/04/16 2004  WBC 12.8*  CREATININE 0.66  LATICACIDVEN 1.3    Estimated Creatinine Clearance: 74.4 mL/min (by C-G formula based on SCr of 0.66 mg/dL).    Allergies  Allergen Reactions  . Eggs Or Egg-Derived Products   . Lincocin [Lincomycin Hcl]     Thank you for allowing pharmacy to be a part of this patient's care.  Laural Benes, Pharm.D., BCPS Clinical Pharmacist 03/05/2016 1:51 AM

## 2016-03-05 NOTE — H&P (Signed)
[place] @ [facility] Admission History and Physical [employer], D.O.  ---------------------------------------------------------------------------------------------------------------------   PATIENT NAME: Patty Coleman MR#: [id number] DATE OF BIRTH: 01-21-1961 DATE OF ADMISSION: 03/04/2016 PRIMARY CARE PHYSICIAN: Pcp Not In System  REQUESTING/REFERRING PHYSICIAN: ED Dr. Cinda Quest  CHIEF COMPLAINT: Chief Complaint  Patient presents with  . Shortness of Breath    HISTORY OF PRESENT ILLNESS: Patty Coleman is a 55 y.o. female with a known history of Home O2 dependent COPD and hyperlipidemia presents to the emergency department for evaluation of shortness of breath. Patient states that she developed progressively worsening shortness of breath associated with a deep cough productive of clear to white sputum. She had a fever to 101 associated with weakness, fatigue, decreased by mouth intake and severe dyspnea on exertion with increased oxygen requirement. She states that her doctor usually calls and Levaquin and steroids for her when this happens to avoid pneumonia however she has been unable to get her doctor. n the emergency department patient desatted to the mid 71s while moving to the bathroom and while on oxygen.  Patient usually uses 3 L of O2 around-the-clock. She also smokes 1/2-2 packs of cigarettes per day.   Otherwise there has been no change in status. Patient has been taking medication as prescribed and there has been no recent change in medication or diet.  There has been no recent illness, travel or sick contacts.    Patient denies fevers/chills, weakness, dizziness, chest pain, shortness of breath, N/V/C/D, abdominal pain, dysuria/frequency, changes in mental status.   EMS/ED COURSE:   Patient received Rocephin, a azithromycin, Solu-Medrol and DuoNeb.  PAST MEDICAL HISTORY: Past Medical History:  Diagnosis Date  . COPD (chronic  obstructive pulmonary disease) ([place])   . Hypercholesteremia       PAST SURGICAL HISTORY: History reviewed. No pertinent surgical history.    SOCIAL HISTORY: Social History  Substance Use Topics  . Smoking status: Current Every Day Smoker  . Smokeless tobacco: Never Used  . Alcohol use No  Patient denies alcohol or drug use but smokes 1-1/2-2 packs of cigarettes per day    FAMILY HISTORY: History reviewed. No pertinent family history.   MEDICATIONS AT HOME: Prior to Admission medications   Medication Sig Start Date End Date Taking? Authorizing Provider  BREO ELLIPTA 100-25 MCG/INH AEPB Take 1 puff by mouth daily.   Yes Historical Provider, MD  COMBIVENT RESPIMAT 20-100 MCG/ACT AERS respimat Take 1 puff by mouth 4 (four) times daily.   Yes Historical Provider, MD  ibuprofen (ADVIL,MOTRIN) 200 MG tablet Take 200 mg by mouth every 6 (six) hours as needed for fever or mild pain.   Yes Historical Provider, MD  INCRUSE ELLIPTA 62.5 MCG/INH AEPB Take 1 puff by mouth daily.   Yes Historical Provider, MD  ipratropium-albuterol (DUONEB) 0.5-2.5 (3) MG/3ML SOLN Take 3 mLs by nebulization every 4 (four) hours as needed (shortness of breath).   Yes Historical Provider, MD  PROAIR HFA 108 [phone] Base) MCG/ACT inhaler Take 2 puffs by mouth every 4 (four) hours as needed.   Yes Historical Provider, MD      DRUG ALLERGIES: Allergies  Allergen Reactions  . Eggs Or Egg-Derived Products   . Lincocin [Lincomycin Hcl]      REVIEW OF SYSTEMS: CONSTITUTIONAL: No fatigue, weakness, Positive fever, chills, negative weight gain/loss, headache EYES: No blurry or double vision. ENT: No tinnitus, postnasal drip, redness or soreness of the oropharynx. RESPIRATORY: Positive dyspnea, cough, wheeze, negative hemoptysis. CARDIOVASCULAR: No  chest pain, orthopnea, palpitations, syncope. GASTROINTESTINAL: No nausea, vomiting, constipation, diarrhea, abdominal pain. No hematemesis, melena or  hematochezia. GENITOURINARY: No dysuria, frequency, hematuria. ENDOCRINE: No polyuria or nocturia. No heat or cold intolerance. HEMATOLOGY: No anemia, bruising, bleeding. INTEGUMENTARY: No rashes, ulcers, lesions. MUSCULOSKELETAL: No pain, arthritis, swelling, gout. NEUROLOGIC: No numbness, tingling, weakness or ataxia. No seizure-type activity. PSYCHIATRIC: No anxiety, depression, insomnia.  PHYSICAL EXAMINATION: VITAL SIGNS: Blood pressure 124/75, pulse 94, temperature 99.4 F (37.4 C), temperature source Oral, resp. rate (!) 23, height 5\' 2"  (1.575 m), weight 73 kg (161 lb), SpO2 98 %.  GENERAL: 55 y.o.-year-old white female patient, well-developed, well-nourished lying in the bed in mild distress HEENT: Head atraumatic, normocephalic. Pupils equal, round, reactive to light and accommodation. No scleral icterus. Extraocular muscles intact. Oropharynx is clear. Mucus membranes moist. NECK: Supple, full range of motion. No JVD, no bruit heard. No cervical lymphadenopathy. CHEST: Mild diffuse wheezing. No rales, rhonchi or crackles. Pursed lipped breathing but no use of accessory muscles of respiration.  No reproducible chest wall tenderness.  CARDIOVASCULAR: S1, S2 normal. No murmurs, rubs, or gallops appreciated. Cap refill <2 seconds. ABDOMEN: Soft, nontender, nondistended. No rebound, guarding, rigidity. Normoactive bowel sounds present in all four quadrants. No organomegaly or mass. EXTREMITIES: Full range of motion. Right lower extremity pitting edema which is chronic. NEUROLOGIC: Cranial nerves II through XII are grossly intact with no focal sensorimotor deficit. Muscle strength 5/5 in all extremities. Sensation intact. Gait not checked. PSYCHIATRIC: The patient is alert and oriented x 3. Normal affect, mood, thought content. SKIN: Warm, dry, and intact without obvious rash, lesion, or ulcer.  LABORATORY PANEL:  CBC  Recent Labs Lab 03/04/16 2004  WBC 12.8*  HGB 13.2  HCT  39.2  PLT 195   ----------------------------------------------------------------------------------------------------------------- Chemistries  Recent Labs Lab 03/04/16 2004  NA 137  K 3.8  CL 97*  CO2 31  GLUCOSE 138*  BUN 6  CREATININE 0.66  CALCIUM 9.4  AST 19  ALT 9*  ALKPHOS 70  BILITOT 0.6   ------------------------------------------------------------------------------------------------------------------ Cardiac Enzymes No results for input(s): TROPONINI in the last 168 hours. ------------------------------------------------------------------------------------------------------------------  RADIOLOGY: Dg Chest 2 View  Result Date: 03/04/2016 CLINICAL DATA:  Shortness of breath today. History of pneumonia and COPD, on oxygen. EXAM: CHEST  2 VIEW COMPARISON:  CT chest July 19, 2014 FINDINGS: Unchanged RIGHT peripheral lung wedge-like consolidation. Mild bronchitic changes. Increased lung volumes with flattened hemidiaphragms. Apical bullous changes. No pleural effusion. No pneumothorax. Cardiomediastinal silhouette is normal, mildly calcified aortic knob. Soft tissue planes and included osseous structures are normal. IMPRESSION: COPD. Persistent wedge-like consolidation RIGHT lung most compatible with scarring though, recommend follow-up CT chest with contrast on nonemergent basis to exclude underlying mass. Electronically Signed   By: Elon Alas M.D.   On: 03/04/2016 20:02    EKG: Sinus tach at 118 with normal axis and nonspecific ST-T wave changes.   IMPRESSION AND PLAN:  This is a 55 y.o. female with a history of home O2 dependent COPD and hyperlipidemia now being admitted with: 1. COPD exacerbation-admit for IV steroids, DuoNeb, oxygen therapy as needed. Pulmonary consult at the patient's request with Dr. Humphrey Rolls. Continue Breo Ellipta, Combivent Respimat, Incruse Ellipta 2. Likely community-acquired pneumonia given leukocytosis, fever and increased oxygen  requirement. We will culture sputum, start IV Rocephin and Zithromax, antipyretics and expectorants. Patient's chest x-ray showed a wedgelike consolidation in the right lung compatible with scarring which recommended follow-up chest CT with contrast to exclude underlying mass. 3. Tobacco  use disorder, severe-nicotine patch ordered. Dangers of smoking while using oxygen therapy discussed and tobacco cessation education provided.  Diet/Nutrition: Heart healthy Fluids: IV normal saline DVT Px: Lovenox, SCDs and early ambulation Code Status: Full  All the records are reviewed and case discussed with ED provider. Management plans discussed with the patient and/or family who express understanding and agree with plan of care.   TOTAL TIME TAKING CARE OF THIS PATIENT: 60 minutes.   Shena Vinluan D.O. on 03/05/2016 at 12:30 AM Between 7am to 6pm - Pager - [phone] After 6pm go to [email or web address] - [employer] [facility] [place] [facility] Office [phone] CC: Primary care physician; Pcp Not In System     Note: This dictation was prepared with Dragon dictation along with smaller phrase technology. Any transcriptional errors that result from this process are unintentional.

## 2016-03-06 DIAGNOSIS — Z7189 Other specified counseling: Secondary | ICD-10-CM

## 2016-03-06 DIAGNOSIS — Z515 Encounter for palliative care: Secondary | ICD-10-CM

## 2016-03-06 LAB — EXPECTORATED SPUTUM ASSESSMENT W GRAM STAIN, RFLX TO RESP C

## 2016-03-06 LAB — BLOOD GAS, ARTERIAL
Acid-Base Excess: 8.3 mmol/L — ABNORMAL HIGH (ref 0.0–2.0)
BICARBONATE: 35.1 mmol/L — AB (ref 20.0–28.0)
FIO2: 0.32
O2 Saturation: 96.2 %
PATIENT TEMPERATURE: 37
PH ART: 7.39 (ref 7.350–7.450)
PO2 ART: 84 mmHg (ref 83.0–108.0)
pCO2 arterial: 58 mmHg — ABNORMAL HIGH (ref 32.0–48.0)

## 2016-03-06 LAB — URINE CULTURE

## 2016-03-06 LAB — BASIC METABOLIC PANEL
ANION GAP: 9 (ref 5–15)
BUN: 15 mg/dL (ref 6–20)
CALCIUM: 9.3 mg/dL (ref 8.9–10.3)
CO2: 32 mmol/L (ref 22–32)
Chloride: 98 mmol/L — ABNORMAL LOW (ref 101–111)
Creatinine, Ser: 0.65 mg/dL (ref 0.44–1.00)
GFR calc non Af Amer: >60 mL/min (ref >60)
Glucose, Bld: 151 mg/dL — ABNORMAL HIGH (ref 65–99)
Potassium: 3.7 mmol/L (ref 3.5–5.1)
Sodium: 139 mmol/L (ref 135–145)

## 2016-03-06 LAB — CBC
HEMATOCRIT: 38.2 % (ref 35.0–47.0)
HEMOGLOBIN: 12.6 g/dL (ref 12.0–16.0)
MCH: 28.9 pg (ref 26.0–34.0)
MCHC: 33 g/dL (ref 32.0–36.0)
MCV: 87.4 fL (ref 80.0–100.0)
Platelets: 215 10*3/uL (ref 150–440)
RBC: 4.37 MIL/uL (ref 3.80–5.20)
RDW: 14.6 % — AB (ref 11.5–14.5)
WBC: 16.1 10*3/uL — AB (ref 3.6–11.0)

## 2016-03-06 LAB — EXPECTORATED SPUTUM ASSESSMENT W REFEX TO RESP CULTURE

## 2016-03-06 MED ORDER — AZITHROMYCIN 500 MG PO TABS
500.0000 mg | ORAL_TABLET | Freq: Every day | ORAL | Status: DC
  Administered 2016-03-06 – 2016-03-07 (×2): 500 mg via ORAL
  Filled 2016-03-06 (×2): qty 1

## 2016-03-06 NOTE — Care Management (Signed)
Ms. Quentin Ore will need 10 liters O2 per nasal cannula in the home at discharge.  Telephone call to [place], [facility] representative, Can bridge 2 tanks together for 10 liters. Husband requesting palliative in the home. Shelbie Ammons RN MSN CCM Care Management

## 2016-03-06 NOTE — Consult Note (Addendum)
Consultation Note Date: 03/06/16  Patient Name: Patty Coleman  DOB: [birth year]  MRN: [id number]  Age / Sex: 55 y.o., female  PCP: Pcp Not In System Referring Physician: No att. providers found  Reason for Consultation: Establishing goals of care  HPI/Patient Profile: 55 y.o. female  with past medical history of COPD on home oxygen and hyperlipidemia admitted on 03/04/2016 with shortness of breath, cough, fever, weakness, and fatigue. Wears 3L [place] at home. Smokes 1.5-2 packs of cigarettes per day. Diagnosed with community-aquired pneumonia and COPD exacerbation-started on antibiotics, IV steriods, and DuoNebs. Palliative medicine consult for goals of care.   Clinical Assessment and Goals of Care: This NP met with patient and husband at bedside. Introduced palliative care. They are familiar with palliative/hospice due to two deaths in the family in the past few months. Husband has already been in contact with individuals regarding hospice with [facility].   Discussed advanced directives and code status. They have wanted to fill out POA and living will paperwork. Packet given. Also discussed and educated on code status. Patient would not want to be intubated, on life support, or resuscitated. Husband respects her decision and they agree with DNR status.   Patient was diagnosed with COPD many years ago and has been on home oxygen. She continues to smoke. She and husband both feel she has greatly declined recently but understand COPD is an irreversible, progressive disease. They wish for the extra support from palliative services at home. Discussed palliative services vs. Hospice services at home. They require more education on hospice meaning no escalation of care. She is not afraid to die if she continues to worsen. Answered questions and offered support.   SUMMARY OF RECOMMENDATIONS    Discussed code  status. DNR/DNI per patient. Durable DNR placed in chart.   Discussed advanced directives and packet given.   Patient and family interested in palliative services at home. Husband has already been in contact with [facility]   PMT will follow-up 11/27 if patient still hospitalized.   Code Status/Advance Care Planning:  DNR   Symptom Management:   Per attending  Additional Recommendations (Limitations, Scope, Preferences):  Full Scope Treatment-except DNR/DNI  Psycho-social/Spiritual:   Desire for further Chaplaincy support:no  Additional Recommendations: Caregiving  Support/Resources and Education on Hospice  Prognosis:   Unable to determine  Discharge Planning: Home with Palliative Services      Primary Diagnoses: Present on Admission: . Community acquired pneumonia   I have reviewed the medical record, interviewed the patient and family, and examined the patient. The following aspects are pertinent.  Past Medical History:  Diagnosis Date  . Cancer ([place])   . COPD (chronic obstructive pulmonary disease) ([place])   . Hypercholesteremia    Social History   Social History  . Marital status: Married    Spouse name: N/A  . Number of children: N/A  . Years of education: N/A   Social History Main Topics  . Smoking status: Current Every Day Smoker  . Smokeless tobacco: Never Used  .  Alcohol use No  . Drug use: Unknown  . Sexual activity: Not Asked   Other Topics Concern  . None   Social History Narrative  . None   History reviewed. No pertinent family history. Scheduled Meds:  Continuous Infusions: PRN Meds:. Medications Prior to Admission:  Prior to Admission medications   Medication Sig Start Date End Date Taking? Authorizing Provider  BREO ELLIPTA 100-25 MCG/INH AEPB Take 1 puff by mouth daily.   Yes Historical Provider, MD  COMBIVENT RESPIMAT 20-100 MCG/ACT AERS respimat Take 1 puff by mouth 4 (four) times daily.   Yes Historical Provider,  MD  ibuprofen (ADVIL,MOTRIN) 200 MG tablet Take 200 mg by mouth every 6 (six) hours as needed for fever or mild pain.   Yes Historical Provider, MD  INCRUSE ELLIPTA 62.5 MCG/INH AEPB Take 1 puff by mouth daily.   Yes Historical Provider, MD  ipratropium-albuterol (DUONEB) 0.5-2.5 (3) MG/3ML SOLN Take 3 mLs by nebulization every 4 (four) hours as needed (shortness of breath).   Yes Historical Provider, MD  PROAIR HFA 108 [phone] Base) MCG/ACT inhaler Take 2 puffs by mouth every 4 (four) hours as needed.   Yes Historical Provider, MD   Allergies  Allergen Reactions  . Eggs Or Egg-Derived Products   . Lincocin [Lincomycin Hcl]    Review of Systems  Constitutional: Positive for activity change, fatigue and fever.  Respiratory: Positive for cough, shortness of breath and wheezing.   Neurological: Positive for weakness.   Physical Exam  Constitutional: She is oriented to person, place, and time. She is cooperative.  Cardiovascular: Regular rhythm and normal heart sounds.   Pulmonary/Chest: She has wheezes.  Dyspnea at rest/pursed lip breathing  Abdominal: Soft. Bowel sounds are normal.  Neurological: She is alert and oriented to person, place, and time.  Skin: Skin is warm and dry. There is pallor.  Psychiatric: She has a normal mood and affect. Her speech is normal and behavior is normal. Cognition and memory are normal.  Nursing note and vitals reviewed.  Vital Signs: BP 139/60   Pulse 77   Temp 97.6 F (36.4 C) (Oral)   Resp 18   Ht _0  (1.575 m)   Wt 73 kg (161 lb)   SpO2 97%   BMI 29.45 kg/m  Pain Assessment: No/denies pain POSS *See Group Information*: S-Acceptable,Sleep, easy to arouse Pain Score: 0-No pain   SpO2: SpO2: 97 % O2 Device:SpO2: 97 % O2 Flow Rate: .O2 Flow Rate (L/min): 2 L/min  IO: Intake/output summary:   Intake/Output Summary (Last 24 hours) at 03/09/16 0747 Last data filed at 03/08/16 1030  Gross per 24 hour  Intake              480 ml  Output                 0 ml  Net              480 ml    LBM: Last BM Date: 03/07/16 Baseline Weight: Weight: 73 kg (161 lb) Most recent weight: Weight: 73 kg (161 lb)     Palliative Assessment/Data: PPS 50%   Flowsheet Rows   Flowsheet Row Most Recent Value  Intake Tab  Referral Department  Pulmonary  Unit at Time of Referral  Med/Surg Unit  Palliative Care Primary Diagnosis  Pulmonary  Date Notified  03/06/16  Palliative Care Type  New Palliative care  Reason for referral  Clarify Goals of Care  Date of Admission  03/04/16  Date first seen by Palliative Care  03/06/16  # of days Palliative referral response time  0 Day(s)  # of days IP prior to Palliative referral  2  Clinical Assessment  Palliative Performance Scale Score  50%  Psychosocial & Spiritual Assessment  Palliative Care Outcomes  Patient/Family meeting held?  Yes  Who was at the meeting?  husband and patient  Palliative Care Outcomes  Clarified goals of care, Counseled regarding hospice, Provided advance care planning, Provided psychosocial or spiritual support, Changed CPR status, Completed durable DNR      Time In: 4712 Time Out: 5271 Time Total: 62mn Greater than 50%  of this time was spent counseling and coordinating care related to the above assessment and plan.  Signed by:  MIhor Dow FNP-C Palliative Medicine Team  Phone: [phone]: [phone]  Please contact Palliative Medicine Team phone at [phone] questions and concerns.  For individual provider: See AShea Evans

## 2016-03-06 NOTE — Progress Notes (Signed)
Pharmacy Antibiotic Note  Patty Coleman is a 55 y.o. female admitted on 03/04/2016 with CAP.  Pharmacy has been consulted for ceftriaxone dosing.  Plan: Day 2 of therapy with Ceftriaxone and Azithromycin  Continue Ceftriaxone 1 gm IV Q24H  Height: 5\' 2"  (157.5 cm) Weight: 161 lb (73 kg) IBW/kg (Calculated) : 50.1  Temp (24hrs), Avg:98.1 F (36.7 C), Min:98 F (36.7 C), Max:98.2 F (36.8 C)   Recent Labs Lab 03/04/16 2004 03/05/16 0627 03/06/16 0614  WBC 12.8* 11.5* 16.1*  CREATININE 0.66 0.55 0.65  LATICACIDVEN 1.3  --   --     Estimated Creatinine Clearance: 74.4 mL/min (by C-G formula based on SCr of 0.65 mg/dL).    Allergies  Allergen Reactions  . Eggs Or Egg-Derived Products   . Lincocin [Lincomycin Hcl]     Thank you for allowing pharmacy to be a part of this patient's care.  Kaytee Taliercio M Jupiter Kabir, Pharm.D., BCPS Clinical Pharmacist 03/06/2016 11:34 AM

## 2016-03-06 NOTE — Progress Notes (Signed)
[place] at [facility]                                                                                                                                                                                  Patient Demographics   Patty Coleman, is a 55 y.o. female, DOB - 02-03-1961, AY:[id number]  Admit date - 03/04/2016   Admitting Physician Harvie Bridge, DO  Outpatient Primary MD for the patient is Pcp Not In System   LOS - 2  Subjective:Patient continues to complain of shortness of breath cough and wheezing but improved compared to admission   Review of Systems:   CONSTITUTIONAL: No documented fever. No fatigue, weakness. No weight gain, no weight loss.  EYES: No blurry or double vision.  ENT: No tinnitus. No postnasal drip. No redness of the oropharynx.  RESPIRATORY: Positive cough, positive wheeze, no hemoptysis. Positive dyspnea.  CARDIOVASCULAR: No chest pain. No orthopnea. No palpitations. No syncope.  GASTROINTESTINAL: No nausea, no vomiting or diarrhea. No abdominal pain. No melena or hematochezia.  GENITOURINARY: No dysuria or hematuria.  ENDOCRINE: No polyuria or nocturia. No heat or cold intolerance.  HEMATOLOGY: No anemia. No bruising. No bleeding.  INTEGUMENTARY: No rashes. No lesions.  MUSCULOSKELETAL: No arthritis. No swelling. No gout.  NEUROLOGIC: No numbness, tingling, or ataxia. No seizure-type activity.  PSYCHIATRIC: No anxiety. No insomnia. No ADD.    Vitals:   Vitals:   03/05/16 1922 03/05/16 1933 03/06/16 0408 03/06/16 0804  BP: 107/74  (!) 116/50 (!) 128/49  Pulse: 95  76 81  Resp: 20  16 19   Temp: 98.2 F (36.8 C)  98.1 F (36.7 C) 98 F (36.7 C)  TempSrc: Oral  Oral Oral  SpO2: 96% 94% 98% 96%  Weight:      Height:        Wt Readings from Last 3 Encounters:  03/04/16 161 lb (73 kg)     Intake/Output Summary (Last 24 hours) at 03/06/16 1238 Last data filed at 03/05/16 1623  Gross per 24 hour   Intake              610 ml  Output              600 ml  Net               10 ml    Physical Exam:   GENERAL: Pleasant-appearing in no apparent distress.  HEAD, EYES, EARS, NOSE AND THROAT: Atraumatic, normocephalic. Extraocular muscles are intact. Pupils equal and reactive to light. Sclerae anicteric. No conjunctival injection. No oro-pharyngeal erythema.  NECK: Supple. There is no jugular venous distention. No bruits, no lymphadenopathy, no  thyromegaly.  HEART: Regular rate and rhythm,. No murmurs, no rubs, no clicks.  LUNGS: Bilateral wheezing No rales or rhonchi.  No accesory muscle usage  ABDOMEN: Soft, flat, nontender, nondistended. Has good bowel sounds. No hepatosplenomegaly appreciated.  EXTREMITIES: No evidence of any cyanosis, clubbing, or peripheral edema.  +2 pedal and radial pulses bilaterally.  NEUROLOGIC: The patient is alert, awake, and oriented x3 with no focal motor or sensory deficits appreciated bilaterally.  SKIN: Moist and warm with no rashes appreciated.  Psych: Not anxious, depressed LN: No inguinal LN enlargement    Antibiotics   Anti-infectives    Start     Dose/Rate Route Frequency Ordered Stop   03/06/16 2000  azithromycin (ZITHROMAX) tablet 500 mg     500 mg Oral Daily 03/06/16 1140     03/05/16 2200  azithromycin (ZITHROMAX) 500 mg in dextrose 5 % 250 mL IVPB  Status:  Discontinued     500 mg 250 mL/hr over 60 Minutes Intravenous Every 24 hours 03/05/16 0135 03/06/16 1140   03/05/16 2130  cefTRIAXone (ROCEPHIN) IVPB 1 g     1 g 100 mL/hr over 30 Minutes Intravenous Every 24 hours 03/05/16 0151     03/04/16 2025  cefTRIAXone (ROCEPHIN) 1-3.74 GM-% IVPB    Comments:  BROWN, KIMREY: cabinet override      03/04/16 2025 03/05/16 0829   03/04/16 2015  cefTRIAXone (ROCEPHIN) 1 g in dextrose 5 % 50 mL IVPB     1 g 100 mL/hr over 30 Minutes Intravenous  Once 03/04/16 2004 03/04/16 2100   03/04/16 2015  azithromycin (ZITHROMAX) 500 mg in dextrose 5 % 250  mL IVPB     500 mg 250 mL/hr over 60 Minutes Intravenous  Once 03/04/16 2004 03/04/16 2209      Medications   Scheduled Meds: . azithromycin  500 mg Oral Daily  . cefTRIAXone  1 g Intravenous Q24H  . guaiFENesin  600 mg Oral BID   And  . dextromethorphan  30 mg Oral BID  . enoxaparin (LOVENOX) injection  40 mg Subcutaneous QHS  . fluticasone furoate-vilanterol  1 puff Inhalation Daily  . Ipratropium-Albuterol  1 puff Inhalation QID  . ipratropium-albuterol  3 mL Inhalation QID  . methylPREDNISolone (SOLU-MEDROL) injection  60 mg Intravenous Q6H  . nicotine  21 mg Transdermal Daily  . sodium chloride flush  3 mL Intravenous Q12H  . umeclidinium bromide  1 puff Inhalation Daily   Continuous Infusions: PRN Meds:.acetaminophen **OR** acetaminophen, bisacodyl, HYDROcodone-acetaminophen, ipratropium-albuterol, magnesium citrate, ondansetron **OR** ondansetron (ZOFRAN) IV, senna-docusate, zolpidem   Data Review:   Micro Results Recent Results (from the past 240 hour(s))  Culture, blood (Routine x 2)     Status: None (Preliminary result)   Collection Time: 03/04/16  8:05 PM  Result Value Ref Range Status   Specimen Description BLOOD LEFT WRIST  Final   Special Requests BOTTLES DRAWN AEROBIC AND ANAEROBIC [place]  Final   Culture NO GROWTH 2 DAYS  Final   Report Status PENDING  Incomplete  Culture, blood (Routine x 2)     Status: None (Preliminary result)   Collection Time: 03/04/16  8:05 PM  Result Value Ref Range Status   Specimen Description BLOOD RIGHT HAND  Final   Special Requests BOTTLES DRAWN AEROBIC AND ANAEROBIC 14CC  Final   Culture NO GROWTH 2 DAYS  Final   Report Status PENDING  Incomplete  Urine culture     Status: Abnormal   Collection Time: 03/04/16 11:46 PM  Result Value Ref Range Status   Specimen Description URINE, RANDOM  Final   Special Requests NONE  Final   Culture MULTIPLE SPECIES PRESENT, SUGGEST RECOLLECTION (A)  Final   Report Status 03/06/2016 FINAL   Final  Culture, expectorated sputum-assessment     Status: None   Collection Time: 03/06/16  8:30 AM  Result Value Ref Range Status   Specimen Description SPU  Final   Special Requests NONE  Final   Sputum evaluation THIS SPECIMEN IS ACCEPTABLE FOR SPUTUM CULTURE  Final   Report Status 03/06/2016 FINAL  Final    Radiology Reports Dg Chest 2 View  Result Date: 03/04/2016 CLINICAL DATA:  Shortness of breath today. History of pneumonia and COPD, on oxygen. EXAM: CHEST  2 VIEW COMPARISON:  CT chest July 19, 2014 FINDINGS: Unchanged RIGHT peripheral lung wedge-like consolidation. Mild bronchitic changes. Increased lung volumes with flattened hemidiaphragms. Apical bullous changes. No pleural effusion. No pneumothorax. Cardiomediastinal silhouette is normal, mildly calcified aortic knob. Soft tissue planes and included osseous structures are normal. IMPRESSION: COPD. Persistent wedge-like consolidation RIGHT lung most compatible with scarring though, recommend follow-up CT chest with contrast on nonemergent basis to exclude underlying mass. Electronically Signed   By: Elon Alas M.D.   On: 03/04/2016 20:02     CBC  Recent Labs Lab 03/04/16 2004 03/05/16 0627 03/06/16 0614  WBC 12.8* 11.5* 16.1*  HGB 13.2 12.2 12.6  HCT 39.2 37.6 38.2  PLT 195 176 215  MCV 86.1 87.3 87.4  MCH 28.9 28.3 28.9  MCHC 33.6 32.5 33.0  RDW 14.9* 14.6* 14.6*  LYMPHSABS 0.7*  --   --   MONOABS 0.5  --   --   EOSABS 0.3  --   --   BASOSABS 0.1  --   --     Chemistries   Recent Labs Lab 03/04/16 2004 03/05/16 0627 03/06/16 0614  NA 137 138 139  K 3.8 4.7 3.7  CL 97* 98* 98*  CO2 31 34* 32  GLUCOSE 138* 150* 151*  BUN 6 10 15   CREATININE 0.66 0.55 0.65  CALCIUM 9.4 9.1 9.3  MG 1.8  --   --   AST 19  --   --   ALT 9*  --   --   ALKPHOS 70  --   --   BILITOT 0.6  --   --     ------------------------------------------------------------------------------------------------------------------ estimated creatinine clearance is 74.4 mL/min (by C-G formula based on SCr of 0.65 mg/dL). ------------------------------------------------------------------------------------------------------------------ No results for input(s): HGBA1C in the last 72 hours. ------------------------------------------------------------------------------------------------------------------ No results for input(s): CHOL, HDL, LDLCALC, TRIG, CHOLHDL, LDLDIRECT in the last 72 hours. ------------------------------------------------------------------------------------------------------------------ No results for input(s): TSH, T4TOTAL, T3FREE, THYROIDAB in the last 72 hours.  Invalid input(s): FREET3 ------------------------------------------------------------------------------------------------------------------ No results for input(s): VITAMINB12, FOLATE, FERRITIN, TIBC, IRON, RETICCTPCT in the last 72 hours.  Coagulation profile  Recent Labs Lab 03/04/16 2004  INR 0.96    No results for input(s): DDIMER in the last 72 hours.  Cardiac Enzymes No results for input(s): CKMB, TROPONINI, MYOGLOBIN in the last 168 hours.  Invalid input(s): CK ------------------------------------------------------------------------------------------------------------------ Invalid input(s): [place]   This is a 55 y.o. female with a history of home O2 dependent COPD and hyperlipidemia now being admitted with: 1. Acute on chronic COPD exacerbation- Continue therapy with azithromycin and ceftriaxone, nebulizer therapy, IV Solu-Medrol Sputum cultures pending 2. Likely community-acquired pneumonia given leukocytosis, fever and increased oxygen requirement.  Continue IV antibiotics. Follow  her sputum cultures Symptoms are improved continue to monitor 3. Tobacco use disorder,  severe-continue nicotine patch     Code Status Orders        Start     Ordered   03/05/16 0136  Full code  Continuous     03/05/16 0136    Code Status History    Date Active Date Inactive Code Status Order ID Comments User Context   This patient has a current code status but no historical code status.           Consults  Pulmonary   DVT Prophylaxis  Lovenox  Lab Results  Component Value Date   PLT 215 03/06/2016     Time Spent in minutes  35 minutes Greater than 50% of time spent in care coordination and counseling patient regarding the condition and plan of care.   Dustin Flock M.D on 03/06/2016 at 12:38 PM  Between 7am to 6pm - Pager - [phone]  After 6pm go to [email or web address] - password [email or web address] [facility]   Office  [phone]

## 2016-03-06 NOTE — Progress Notes (Signed)
PHARMACIST - PHYSICIAN COMMUNICATION  CONCERNING: Antibiotic IV to Oral Route Change Policy  RECOMMENDATION: This patient is receiving Azithromycin  by the intravenous route.  Based on criteria approved by the Pharmacy and Therapeutics Committee, the antibiotic(s) is/are being converted to the equivalent oral dose form(s).   DESCRIPTION: These criteria include:  Patient being treated for a respiratory tract infection, urinary tract infection, cellulitis or clostridium difficile associated diarrhea if on metronidazole  The patient is not neutropenic and does not exhibit a GI malabsorption state  The patient is eating (either orally or via tube) and/or has been taking other orally administered medications for a least 24 hours  The patient is improving clinically and has a Tmax < 100.5  If you have questions about this conversion, please contact the Pharmacy Department   []   [phone] )  [place], PharmD Clinical Pharmacist 03/06/2016 11:41 AM

## 2016-03-06 NOTE — Consult Note (Addendum)
Pulmonary Critical Care  Initial Consult Note   Londan Coleman [id number] DOB: [birth year] DOA: 03/04/2016  Referring physician: Dustin Flock PCP: Pcp Not In System   Chief Complaint: Shortness of Breath  HPI: Patty Coleman is a 55 y.o. 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 Patient has had increase in her cough noted. She states that she has had increased sputum production noted. No blood in the sputum. Patient has no fevr noted. She has had to increase her oxygen and still has had drop in her sats with any type of ambulation. She has a concerntrator at home that is able to go up to 5 lpm but may need a newer concentrator with better flow rates.   Review of Systems:  Constitutional:  No weight loss, night sweats, Fevers, chills, +fatigue.  HEENT:  No headaches, nasal congestion, post nasal drip,  Cardio-vascular:  No chest pain, anasarca, dizziness, palpitations  GI:  No heartburn, indigestion, abdominal pain, nausea, vomiting, diarrhea  Resp:  +shortness of breath +productive cough, No coughing up of blood. +wheezing Skin:  no rash or lesions.  Musculoskeletal:  No joint pain or swelling.   Remainder ROS performed and is unremarkable other than noted in HPI  Past Medical History:  Diagnosis Date  . COPD (chronic obstructive pulmonary disease) ([place])   . Hypercholesteremia    History reviewed. No pertinent surgical history. Social History:  reports that she has been smoking.  She has never used smokeless tobacco. She reports that she does not drink alcohol. Her drug history is not on file.  Allergies  Allergen Reactions  . Eggs Or Egg-Derived Products   . Lincocin [Lincomycin Hcl]     History reviewed. No pertinent family history.  Prior to Admission medications   Medication Sig Start Date End Date Taking?  Authorizing Provider  BREO ELLIPTA 100-25 MCG/INH AEPB Take 1 puff by mouth daily.   Yes Historical Provider, MD  COMBIVENT RESPIMAT 20-100 MCG/ACT AERS respimat Take 1 puff by mouth 4 (four) times daily.   Yes Historical Provider, MD  ibuprofen (ADVIL,MOTRIN) 200 MG tablet Take 200 mg by mouth every 6 (six) hours as needed for fever or mild pain.   Yes Historical Provider, MD  INCRUSE ELLIPTA 62.5 MCG/INH AEPB Take 1 puff by mouth daily.   Yes Historical Provider, MD  ipratropium-albuterol (DUONEB) 0.5-2.5 (3) MG/3ML SOLN Take 3 mLs by nebulization every 4 (four) hours as needed (shortness of breath).   Yes Historical Provider, MD  PROAIR HFA 108 [phone] Base) MCG/ACT inhaler Take 2 puffs by mouth every 4 (four) hours as needed.   Yes Historical Provider, MD   Physical Exam: Vitals:   03/05/16 1922 03/05/16 1933 03/06/16 0408 03/06/16 0804  BP: 107/74  (!) 116/50 (!) 128/49  Pulse: 95  76 81  Resp: 20  16 19   Temp: 98.2 F (36.8 C)  98.1 F (36.7 C) 98 F (36.7 C)  TempSrc: Oral  Oral Oral  SpO2: 96% 94% 98% 96%  Weight:      Height:        Wt Readings from Last 3 Encounters:  03/04/16 73 kg (161 lb)    General:  Appears calm and comfortable Eyes: PERRL, normal lids, irises & conjunctiva ENT: grossly normal hearing, lips & tongue Neck: no LAD, masses or thyromegaly Cardiovascular: RRR, no m/r/g. +LE edema. Respiratory: distant rhinchi  bilaterally, Normal respiratory effort. Abdomen: soft, nontender Skin: no rash or induration seen on limited exam Musculoskeletal: grossly normal tone BUE/BLE Psychiatric: grossly normal mood and affect Neurologic: grossly non-focal.          Labs on Admission:  Basic Metabolic Panel:  Recent Labs Lab 03/04/16 2004 03/05/16 0627 03/06/16 0614  NA 137 138 139  K 3.8 4.7 3.7  CL 97* 98* 98*  CO2 31 34* 32  GLUCOSE 138* 150* 151*  BUN 6 10 15   CREATININE 0.66 0.55 0.65  CALCIUM 9.4 9.1 9.3  MG 1.8  --   --   PHOS 4.1  --   --     Liver Function Tests:  Recent Labs Lab 03/04/16 2004  AST 19  ALT 9*  ALKPHOS 70  BILITOT 0.6  PROT 7.7  ALBUMIN 4.2   No results for input(s): LIPASE, AMYLASE in the last 168 hours. No results for input(s): AMMONIA in the last 168 hours. CBC:  Recent Labs Lab 03/04/16 2004 03/05/16 0627 03/06/16 0614  WBC 12.8* 11.5* 16.1*  NEUTROABS 11.3*  --   --   HGB 13.2 12.2 12.6  HCT 39.2 37.6 38.2  MCV 86.1 87.3 87.4  PLT 195 176 215   Cardiac Enzymes: No results for input(s): CKTOTAL, CKMB, CKMBINDEX, TROPONINI in the last 168 hours.  BNP (last 3 results) No results for input(s): BNP in the last 8760 hours.  ProBNP (last 3 results) No results for input(s): PROBNP in the last 8760 hours.  CBG: No results for input(s): GLUCAP in the last 168 hours.  Radiological Exams on Admission: Dg Chest 2 View  Result Date: 03/04/2016 CLINICAL DATA:  Shortness of breath today. History of pneumonia and COPD, on oxygen. EXAM: CHEST  2 VIEW COMPARISON:  CT chest July 19, 2014 FINDINGS: Unchanged RIGHT peripheral lung wedge-like consolidation. Mild bronchitic changes. Increased lung volumes with flattened hemidiaphragms. Apical bullous changes. No pleural effusion. No pneumothorax. Cardiomediastinal silhouette is normal, mildly calcified aortic knob. Soft tissue planes and included osseous structures are normal. IMPRESSION: COPD. Persistent wedge-like consolidation RIGHT lung most compatible with scarring though, recommend follow-up CT chest with contrast on nonemergent basis to exclude underlying mass. Electronically Signed   By: Elon Alas M.D.   On: 03/04/2016 20:02    EKG: Independently reviewed.  Assessment/Plan Active Problems:   Community acquired pneumonia   1. Acute on chronic hypoxic respiratory failure -she has been desaturating on the 4 lpm flow rate -currently she has a 5 lpm flow rate limited machine.  -I think she should have a 10 lpm flow rate machine to be  adequate for her home needs -The husband wants to make sure that she has adequate equipment and also wants to make sure his home circuit can handle the power requirements -spoke with case management and will try to see what can be done regarding her O2 equipment -will get Palliative care consult  2. Right lung scarring vs infiltrate has been present over the course of the last 2 years -this has been followed in the office and has shown stability with some improvement.  -I have reviewed her films back to 2015 -Last CT scan was in April and results have been noted  3. Ongoing tobacco abuse -unfortunately she is still smoking despite efforts to get her to stop smoking -I think this has further contributed to her decline in her status   Code Status: full code  Family Communication: husband in room Disposition Plan: home  Time  spent: 63min    I have personally obtained a history, examined the patient, evaluated laboratory and imaging results, formulated the assessment and plan and placed orders.  The Patient requires high complexity decision making for assessment and support.    Allyne Gee, MD [facility] Pulmonary Critical Care Medicine Sleep Medicine

## 2016-03-07 ENCOUNTER — Encounter: Payer: Self-pay | Admitting: Radiology

## 2016-03-07 ENCOUNTER — Inpatient Hospital Stay: Payer: Medicaid Other

## 2016-03-07 MED ORDER — IOPAMIDOL (ISOVUE-300) INJECTION 61%
75.0000 mL | Freq: Once | INTRAVENOUS | Status: AC | PRN
  Administered 2016-03-07: 75 mL via INTRAVENOUS

## 2016-03-07 MED ORDER — IPRATROPIUM-ALBUTEROL 0.5-2.5 (3) MG/3ML IN SOLN
3.0000 mL | Freq: Three times a day (TID) | RESPIRATORY_TRACT | Status: DC
Start: 2016-03-07 — End: 2016-03-08
  Administered 2016-03-07 – 2016-03-08 (×3): 3 mL via RESPIRATORY_TRACT
  Filled 2016-03-07 (×7): qty 3

## 2016-03-07 NOTE — Progress Notes (Signed)
[place] at [facility]                                                                                                                                                                                  Patient Demographics   Patty Coleman, is a 55 y.o. female, DOB - 1960/12/15, [id number]  Admit date - 03/04/2016   Admitting Physician Harvie Bridge, DO  Outpatient Primary MD for the patient is Pcp Not In System   LOS - 3  Subjective:Patient Still having shortness of breath and coughing was seen by her pulmonologist yesterday   Review of Systems:   CONSTITUTIONAL: No documented fever. No fatigue, weakness. No weight gain, no weight loss.  EYES: No blurry or double vision.  ENT: No tinnitus. No postnasal drip. No redness of the oropharynx.  RESPIRATORY: Positive cough, positive wheeze, no hemoptysis. Positive dyspnea.  CARDIOVASCULAR: No chest pain. No orthopnea. No palpitations. No syncope.  GASTROINTESTINAL: No nausea, no vomiting or diarrhea. No abdominal pain. No melena or hematochezia.  GENITOURINARY: No dysuria or hematuria.  ENDOCRINE: No polyuria or nocturia. No heat or cold intolerance.  HEMATOLOGY: No anemia. No bruising. No bleeding.  INTEGUMENTARY: No rashes. No lesions.  MUSCULOSKELETAL: No arthritis. No swelling. No gout.  NEUROLOGIC: No numbness, tingling, or ataxia. No seizure-type activity.  PSYCHIATRIC: No anxiety. No insomnia. No ADD.    Vitals:   Vitals:   03/06/16 1954 03/07/16 0430 03/07/16 0801 03/07/16 0822  BP:  (!) 124/59 112/86   Pulse:  66 75   Resp:  18 18   Temp:  97.6 F (36.4 C) 97.9 F (36.6 C)   TempSrc:  Oral Oral   SpO2: 98% 100% 98% 96%  Weight:      Height:        Wt Readings from Last 3 Encounters:  03/04/16 161 lb (73 kg)    No intake or output data in the 24 hours ending 03/07/16 1203  Physical Exam:   GENERAL: Pleasant-appearing in no apparent distress.  HEAD, EYES, EARS, NOSE  AND THROAT: Atraumatic, normocephalic. Extraocular muscles are intact. Pupils equal and reactive to light. Sclerae anicteric. No conjunctival injection. No oro-pharyngeal erythema.  NECK: Supple. There is no jugular venous distention. No bruits, no lymphadenopathy, no thyromegaly.  HEART: Regular rate and rhythm,. No murmurs, no rubs, no clicks.  LUNGS: Bilateral wheezing No rales or rhonchi.  No accesory muscle usage  ABDOMEN: Soft, flat, nontender, nondistended. Has good bowel sounds. No hepatosplenomegaly appreciated.  EXTREMITIES: No evidence of any cyanosis, clubbing, or peripheral edema.  +2 pedal and radial pulses bilaterally.  NEUROLOGIC: The patient is alert, awake, and oriented x3 with  no focal motor or sensory deficits appreciated bilaterally.  SKIN: Moist and warm with no rashes appreciated.  Psych: Not anxious, depressed LN: No inguinal LN enlargement    Antibiotics   Anti-infectives    Start     Dose/Rate Route Frequency Ordered Stop   03/06/16 2000  azithromycin (ZITHROMAX) tablet 500 mg     500 mg Oral Daily 03/06/16 1140     03/05/16 2200  azithromycin (ZITHROMAX) 500 mg in dextrose 5 % 250 mL IVPB  Status:  Discontinued     500 mg 250 mL/hr over 60 Minutes Intravenous Every 24 hours 03/05/16 0135 03/06/16 1140   03/05/16 2130  cefTRIAXone (ROCEPHIN) IVPB 1 g     1 g 100 mL/hr over 30 Minutes Intravenous Every 24 hours 03/05/16 0151     03/04/16 2025  cefTRIAXone (ROCEPHIN) 1-3.74 GM-% IVPB    Comments:  BROWN, KIMREY: cabinet override      03/04/16 2025 03/05/16 0829   03/04/16 2015  cefTRIAXone (ROCEPHIN) 1 g in dextrose 5 % 50 mL IVPB     1 g 100 mL/hr over 30 Minutes Intravenous  Once 03/04/16 2004 03/04/16 2100   03/04/16 2015  azithromycin (ZITHROMAX) 500 mg in dextrose 5 % 250 mL IVPB     500 mg 250 mL/hr over 60 Minutes Intravenous  Once 03/04/16 2004 03/04/16 2209      Medications   Scheduled Meds: . azithromycin  500 mg Oral Daily  . cefTRIAXone  1  g Intravenous Q24H  . guaiFENesin  600 mg Oral BID   And  . dextromethorphan  30 mg Oral BID  . enoxaparin (LOVENOX) injection  40 mg Subcutaneous QHS  . fluticasone furoate-vilanterol  1 puff Inhalation Daily  . Ipratropium-Albuterol  1 puff Inhalation QID  . ipratropium-albuterol  3 mL Inhalation TID  . methylPREDNISolone (SOLU-MEDROL) injection  60 mg Intravenous Q6H  . nicotine  21 mg Transdermal Daily  . sodium chloride flush  3 mL Intravenous Q12H  . umeclidinium bromide  1 puff Inhalation Daily   Continuous Infusions: PRN Meds:.acetaminophen **OR** acetaminophen, bisacodyl, HYDROcodone-acetaminophen, ipratropium-albuterol, magnesium citrate, ondansetron **OR** ondansetron (ZOFRAN) IV, senna-docusate, zolpidem   Data Review:   Micro Results Recent Results (from the past 240 hour(s))  Culture, blood (Routine x 2)     Status: None (Preliminary result)   Collection Time: 03/04/16  8:05 PM  Result Value Ref Range Status   Specimen Description BLOOD LEFT WRIST  Final   Special Requests BOTTLES DRAWN AEROBIC AND ANAEROBIC [place]  Final   Culture NO GROWTH 2 DAYS  Final   Report Status PENDING  Incomplete  Culture, blood (Routine x 2)     Status: None (Preliminary result)   Collection Time: 03/04/16  8:05 PM  Result Value Ref Range Status   Specimen Description BLOOD RIGHT HAND  Final   Special Requests BOTTLES DRAWN AEROBIC AND ANAEROBIC 14CC  Final   Culture NO GROWTH 2 DAYS  Final   Report Status PENDING  Incomplete  Urine culture     Status: Abnormal   Collection Time: 03/04/16 11:46 PM  Result Value Ref Range Status   Specimen Description URINE, RANDOM  Final   Special Requests NONE  Final   Culture MULTIPLE SPECIES PRESENT, SUGGEST RECOLLECTION (A)  Final   Report Status 03/06/2016 FINAL  Final  Culture, expectorated sputum-assessment     Status: None   Collection Time: 03/06/16  8:30 AM  Result Value Ref Range Status   Specimen Description SPU  Final   Special  Requests NONE  Final   Sputum evaluation THIS SPECIMEN IS ACCEPTABLE FOR SPUTUM CULTURE  Final   Report Status 03/06/2016 FINAL  Final  Culture, respiratory (NON-Expectorated)     Status: None (Preliminary result)   Collection Time: 03/06/16  8:30 AM  Result Value Ref Range Status   Specimen Description SPU  Final   Special Requests NONE Reflexed from [id number]  Final   Gram Stain   Final    ABUNDANT WBC PRESENT, PREDOMINANTLY PMN FEW SQUAMOUS EPITHELIAL CELLS PRESENT FEW GRAM POSITIVE COCCI FEW GRAM POSITIVE RODS RARE GRAM NEGATIVE RODS    Culture   Final    CULTURE REINCUBATED FOR BETTER GROWTH Performed at [facility]    Report Status PENDING  Incomplete    Radiology Reports Dg Chest 2 View  Result Date: 03/04/2016 CLINICAL DATA:  Shortness of breath today. History of pneumonia and COPD, on oxygen. EXAM: CHEST  2 VIEW COMPARISON:  CT chest July 19, 2014 FINDINGS: Unchanged RIGHT peripheral lung wedge-like consolidation. Mild bronchitic changes. Increased lung volumes with flattened hemidiaphragms. Apical bullous changes. No pleural effusion. No pneumothorax. Cardiomediastinal silhouette is normal, mildly calcified aortic knob. Soft tissue planes and included osseous structures are normal. IMPRESSION: COPD. Persistent wedge-like consolidation RIGHT lung most compatible with scarring though, recommend follow-up CT chest with contrast on nonemergent basis to exclude underlying mass. Electronically Signed   By: Elon Alas M.D.   On: 03/04/2016 20:02     CBC  Recent Labs Lab 03/04/16 2004 03/05/16 0627 03/06/16 0614  WBC 12.8* 11.5* 16.1*  HGB 13.2 12.2 12.6  HCT 39.2 37.6 38.2  PLT 195 176 215  MCV 86.1 87.3 87.4  MCH 28.9 28.3 28.9  MCHC 33.6 32.5 33.0  RDW 14.9* 14.6* 14.6*  LYMPHSABS 0.7*  --   --   MONOABS 0.5  --   --   EOSABS 0.3  --   --   BASOSABS 0.1  --   --     Chemistries   Recent Labs Lab 03/04/16 2004 03/05/16 0627 03/06/16 0614  NA  137 138 139  K 3.8 4.7 3.7  CL 97* 98* 98*  CO2 31 34* 32  GLUCOSE 138* 150* 151*  BUN 6 10 15   CREATININE 0.66 0.55 0.65  CALCIUM 9.4 9.1 9.3  MG 1.8  --   --   AST 19  --   --   ALT 9*  --   --   ALKPHOS 70  --   --   BILITOT 0.6  --   --    ------------------------------------------------------------------------------------------------------------------ estimated creatinine clearance is 74.4 mL/min (by C-G formula based on SCr of 0.65 mg/dL). ------------------------------------------------------------------------------------------------------------------ No results for input(s): HGBA1C in the last 72 hours. ------------------------------------------------------------------------------------------------------------------ No results for input(s): CHOL, HDL, LDLCALC, TRIG, CHOLHDL, LDLDIRECT in the last 72 hours. ------------------------------------------------------------------------------------------------------------------ No results for input(s): TSH, T4TOTAL, T3FREE, THYROIDAB in the last 72 hours.  Invalid input(s): FREET3 ------------------------------------------------------------------------------------------------------------------ No results for input(s): VITAMINB12, FOLATE, FERRITIN, TIBC, IRON, RETICCTPCT in the last 72 hours.  Coagulation profile  Recent Labs Lab 03/04/16 2004  INR 0.96    No results for input(s): DDIMER in the last 72 hours.  Cardiac Enzymes No results for input(s): CKMB, TROPONINI, MYOGLOBIN in the last 168 hours.  Invalid input(s): CK ------------------------------------------------------------------------------------------------------------------ Invalid input(s): [place]   This is a 55 y.o. female with a history of home O2 dependent COPD and hyperlipidemia now being admitted with: 1.  Acute on chronic COPD exacerbation- Continue therapy with azithromycin and ceftriaxone, nebulizer therapy, IV Solu-Medrol Sputum  cultures pendingStill very symptomatic chest x-ray was inconclusive I will order CT scan of the chest 2. Likely community-acquired pneumonia given leukocytosis, fever and increased oxygen requirement.  Continue IV antibiotics. Follow her sputum cultures Symptoms are improved continue to monitor 3. Tobacco use disorder, severe-continue nicotine patch     Code Status Orders        Start     Ordered   03/05/16 0136  Full code  Continuous     03/05/16 0136    Code Status History    Date Active Date Inactive Code Status Order ID Comments User Context   This patient has a current code status but no historical code status.           Consults  Pulmonary   DVT Prophylaxis  Lovenox  Lab Results  Component Value Date   PLT 215 03/06/2016     Time Spent in minutes  32 minutes Greater than 50% of time spent in care coordination and counseling patient regarding the condition and plan of care.   Dustin Flock M.D on 03/07/2016 at 12:03 PM  Between 7am to 6pm - Pager - [phone]  After 6pm go to [email or web address] - password [email or web address] [facility]   Office  [phone]

## 2016-03-08 LAB — CULTURE, RESPIRATORY

## 2016-03-08 LAB — CULTURE, RESPIRATORY W GRAM STAIN: Culture: NORMAL

## 2016-03-08 MED ORDER — HYDROCODONE-ACETAMINOPHEN 5-325 MG PO TABS: 1.0000 | ORAL_TABLET | ORAL | 0 refills | Status: DC | PRN

## 2016-03-08 MED ORDER — LEVOFLOXACIN 500 MG PO TABS: 500.0000 mg | ORAL_TABLET | Freq: Every day | ORAL | 0 refills | Status: AC

## 2016-03-08 MED ORDER — GUAIFENESIN ER 600 MG PO TB12: 600.0000 mg | ORAL_TABLET | Freq: Two times a day (BID) | ORAL | Status: DC

## 2016-03-08 MED ORDER — GUAIFENESIN-CODEINE 100-10 MG/5ML PO SOLN: 10.0000 mL | Freq: Three times a day (TID) | ORAL | 0 refills | Status: DC | PRN

## 2016-03-08 MED ORDER — GUAIFENESIN ER 600 MG PO TB12: 600.0000 mg | ORAL_TABLET | Freq: Two times a day (BID) | ORAL | 0 refills | Status: DC

## 2016-03-08 MED ORDER — ZOLPIDEM TARTRATE 5 MG PO TABS: 5.0000 mg | ORAL_TABLET | Freq: Every evening | ORAL | 0 refills | Status: DC | PRN

## 2016-03-08 NOTE — Progress Notes (Signed)
Patient discharged home today with husband. Husband complemented the overalll nursing care. Instructions giving to patient and she is agreeable to plan of care. I also instructed her husband to pick up her abx at the pharmacy.

## 2016-03-08 NOTE — Discharge Instructions (Signed)
[place] at [place]:  Regular diet  DISCHARGE CONDITION:  Good  ACTIVITY:  Activity as tolerated  OXYGEN:  Home Oxygen: Yes.     Oxygen Delivery: 10 liters/min via Patient connected to nasal cannula oxygen  DISCHARGE LOCATION:  home    ADDITIONAL DISCHARGE INSTRUCTION:   If you experience worsening of your admission symptoms, develop shortness of breath, life threatening emergency, suicidal or homicidal thoughts you must seek medical attention immediately by calling 911 or calling your MD immediately  if symptoms less severe.  You Must read complete instructions/literature along with all the possible adverse reactions/side effects for all the Medicines you take and that have been prescribed to you. Take any new Medicines after you have completely understood and accpet all the possible adverse reactions/side effects.   Please note  You were cared for by a hospitalist during your hospital stay. If you have any questions about your discharge medications or the care you received while you were in the hospital after you are discharged, you can call the unit and asked to speak with the hospitalist on call if the hospitalist that took care of you is not available. Once you are discharged, your primary care physician will handle any further medical issues. Please note that NO REFILLS for any discharge medications will be authorized once you are discharged, as it is imperative that you return to your primary care physician (or establish a relationship with a primary care physician if you do not have one) for your aftercare needs so that they can reassess your need for medications and monitor your lab values.

## 2016-03-08 NOTE — Care Management Note (Addendum)
Case Management Note  Patient Details  Name: Anays Dowler MRN: [id number] Date of Birth: 1960-10-17  Subjective/Objective:      Discharged home with husband on 4L N/C.   02 saturations 98% on 4L N/C. Husband will contact case management tomorrow if he has not been contacted by Palliative Care. Palliative Care Consult made 03/06/16.            Action/Plan:   Expected Discharge Date:                  Expected Discharge Plan:     In-House Referral:     Discharge planning Services     Post Acute Care Choice:    Choice offered to:     DME Arranged:    DME Agency:     HH Arranged:    HH Agency:     Status of Service:     If discussed at [employer] of Stay Meetings, dates discussed:    Additional Comments:  Waldo Damian A, RN 03/08/2016, 10:32 AM

## 2016-03-08 NOTE — Care Management Note (Signed)
Case Management Note  Patient Details  Name: Patty Coleman MRN: [id number] Date of Birth: 30-Jan-1961  Subjective/Objective:     Dr Ulysees Barns wrote an order for 4L N/C today which was faxed to [place] at [facility].                Action/Plan:   Expected Discharge Date:                  Expected Discharge Plan:     In-House Referral:     Discharge planning Services     Post Acute Care Choice:    Choice offered to:     DME Arranged:    DME Agency:     HH Arranged:    HH Agency:     Status of Service:     If discussed at [employer] of Stay Meetings, dates discussed:    Additional Comments:  Aayush Gelpi A, RN 03/08/2016, 2:31 PM

## 2016-03-09 DIAGNOSIS — Z7189 Other specified counseling: Secondary | ICD-10-CM

## 2016-03-09 DIAGNOSIS — Z66 Do not resuscitate: Secondary | ICD-10-CM

## 2016-03-09 LAB — CULTURE, BLOOD (ROUTINE X 2)
CULTURE: NO GROWTH
Culture: NO GROWTH

## 2016-03-14 NOTE — Discharge Summary (Signed)
[place] at [facility], 55 y.o., DOB 02/23/1961, MRN [id number]. Admission date: 03/04/2016 Discharge Date 03/14/2016 Primary MD Pcp Not In [place], DO  Admission Diagnosis  Hypoxia [R09.02] COPD exacerbation ([place]) [J44.1] Community acquired pneumonia, unspecified laterality [J18.9]  Discharge Diagnosis   Active Problems: Acute on chronic respiratory failure Acute on chronic COPD exasperation   Community acquired pneumonia   Palliative care encounter    DNR (do not resuscitate) discussion          Hospital CoursePamela Ernesto Rutherford Quentin Coleman is a 55 y.o. female with a known history of Home O2 dependent COPD and hyperlipidemia presents to the emergency department for evaluation of shortness of breath. Patient states that she developed progressively worsening shortness of breath associated with a deep cough productive of clear to white sputum. Patient's chest x-ray suggestive of possible pneumonia she CT scan showed bronchitic changes. Patient was admitted and treated for acute on chronic COPD exasperation as well as pneumonia. With slow improvement in her symptoms. She was also seen by her pulmonologist who recommended a higher oxygen therapy which is currently being arranged.             Consults  pulmonary/intensive care  Significant Tests:  See full reports for all details     Dg Chest 2 View  Result Date: 03/04/2016 CLINICAL DATA:  Shortness of breath today. History of pneumonia and COPD, on oxygen. EXAM: CHEST  2 VIEW COMPARISON:  CT chest July 19, 2014 FINDINGS: Unchanged RIGHT peripheral lung wedge-like consolidation. Mild bronchitic changes. Increased lung volumes with flattened hemidiaphragms. Apical bullous changes. No pleural effusion. No pneumothorax. Cardiomediastinal silhouette is normal, mildly calcified aortic knob. Soft tissue planes and included osseous structures are  normal. IMPRESSION: COPD. Persistent wedge-like consolidation RIGHT lung most compatible with scarring though, recommend follow-up CT chest with contrast on nonemergent basis to exclude underlying mass. Electronically Signed   By: Elon Alas M.D.   On: 03/04/2016 20:02   Ct Chest W Contrast  Result Date: 03/07/2016 CLINICAL DATA:  Pt with increased SOB over the last few days. History of COPD. EXAM: CT CHEST WITH CONTRAST TECHNIQUE: Multidetector CT imaging of the chest was performed during intravenous contrast administration. CONTRAST:  61mL ISOVUE-300 IOPAMIDOL (ISOVUE-300) INJECTION 61% COMPARISON:  Chest CTs dated 07/19/2014 and 04/20/2014. FINDINGS: Cardiovascular: No aortic aneurysm or dissection. Mild atherosclerotic calcifications at the aortic arch. Heart size is normal. No pericardial effusion. Mediastinum/Nodes: No mass or enlarged lymph nodes within the mediastinum or perihilar regions. Esophagus is unremarkable. Trachea and central bronchi are unremarkable. Mild bronchiectasis noted at each lung base Lungs/Pleura: Mild mucous plugging noted with an segmental bronchi to the left lower lobe. Associated mild atelectasis at the left lung base. Stable peripheral consolidation within the lateral aspects of the right lung. Similar small consolidation within the left upper lobe is stable. Both are favored to be chronic scarring/fibrosis related to previous infectious or inflammatory process. Emphysematous changes again noted bilaterally, moderate to severe in degree within the upper lobes. Upper Abdomen: Multiple stones within the gallbladder, incompletely imaged. Limited images of the upper abdomen are otherwise unremarkable. Musculoskeletal: No acute or significant osseous finding. Superficial soft tissues are unremarkable. IMPRESSION: 1. Mild mucous plugging/secretions within segmental bronchi to the left lower lobe, some occlusive, with associated mild peripheral atelectasis. The  plugging/secretions may be related to an acute infectious or inflammatory bronchiolitis. 2. No other new lung findings. Stable emphysematous changes, at least moderate in  degree, upper lobe predominant. Stable chronic appearing peripheral consolidations within the outer right lung and left upper lobe, likely sequela of previous infectious or inflammatory process. 3. Aortic atherosclerosis. 4. Cholelithiasis. Electronically Signed   By: Franki Cabot M.D.   On: 03/07/2016 17:25       Today   Subjective:   Patty Coleman feeling better very anxious to go home  Objective:   Blood pressure 139/60, pulse 77, temperature 97.6 F (36.4 C), temperature source Oral, resp. rate 18, height 5\' 2"  (1.575 m), weight 161 lb (73 kg), SpO2 97 %.  . No intake or output data in the 24 hours ending 03/14/16 1447  Exam VITAL SIGNS: Blood pressure 139/60, pulse 77, temperature 97.6 F (36.4 C), temperature source Oral, resp. rate 18, height 5\' 2"  (1.575 m), weight 161 lb (73 kg), SpO2 97 %.  GENERAL:  55 y.o.-year-old patient lying in the bed with no acute distress.  EYES: Pupils equal, round, reactive to light and accommodation. No scleral icterus. Extraocular muscles intact.  HEENT: Head atraumatic, normocephalic. Oropharynx and nasopharynx clear.  NECK:  Supple, no jugular venous distention. No thyroid enlargement, no tenderness.  LUNGS:Diminished breath sounds bilaterally  no wheezing, rales,rhonchi or crepitation. No use of accessory muscles of respiration.  CARDIOVASCULAR: S1, S2 normal. No murmurs, rubs, or gallops.  ABDOMEN: Soft, nontender, nondistended. Bowel sounds present. No organomegaly or mass.  EXTREMITIES: No pedal edema, cyanosis, or clubbing.  NEUROLOGIC: Cranial nerves II through XII are intact. Muscle strength 5/5 in all extremities. Sensation intact. Gait not checked.  PSYCHIATRIC: The patient is alert and oriented x 3.  SKIN: No obvious rash, lesion, or ulcer.   Data Review      CBC w Diff:  Lab Results  Component Value Date   WBC 16.1 (H) 03/06/2016   HGB 12.6 03/06/2016   HGB 15.4 11/13/2012   HCT 38.2 03/06/2016   HCT 45.6 11/13/2012   PLT 215 03/06/2016   PLT 244 11/13/2012   LYMPHOPCT 5 03/04/2016   LYMPHOPCT 5.4 05/10/2012   MONOPCT 4 03/04/2016   MONOPCT 1.7 05/10/2012   EOSPCT 2 03/04/2016   EOSPCT 0.0 05/10/2012   BASOPCT 1 03/04/2016   BASOPCT 0.2 05/10/2012   CMP:  Lab Results  Component Value Date   NA 139 03/06/2016   NA 138 11/13/2012   K 3.7 03/06/2016   K 4.1 11/13/2012   CL 98 (L) 03/06/2016   CL 101 11/13/2012   CO2 32 03/06/2016   CO2 32 11/13/2012   BUN 15 03/06/2016   BUN 10 11/13/2012   CREATININE 0.65 03/06/2016   CREATININE 0.80 11/13/2012   PROT 7.7 03/04/2016   PROT 8.1 05/09/2012   ALBUMIN 4.2 03/04/2016   ALBUMIN 3.9 05/09/2012   BILITOT 0.6 03/04/2016   BILITOT 0.7 05/09/2012   ALKPHOS 70 03/04/2016   ALKPHOS 98 05/09/2012   AST 19 03/04/2016   AST 22 05/09/2012   ALT 9 (L) 03/04/2016   ALT 13 05/09/2012  .  Micro Results Recent Results (from the past 240 hour(s))  Culture, blood (Routine x 2)     Status: None   Collection Time: 03/04/16  8:05 PM  Result Value Ref Range Status   Specimen Description BLOOD LEFT WRIST  Final   Special Requests BOTTLES DRAWN AEROBIC AND ANAEROBIC [place]  Final   Culture NO GROWTH 5 DAYS  Final   Report Status 03/09/2016 FINAL  Final  Culture, blood (Routine x 2)     Status: None  Collection Time: 03/04/16  8:05 PM  Result Value Ref Range Status   Specimen Description BLOOD RIGHT HAND  Final   Special Requests BOTTLES DRAWN AEROBIC AND ANAEROBIC 14CC  Final   Culture NO GROWTH 5 DAYS  Final   Report Status 03/09/2016 FINAL  Final  Urine culture     Status: Abnormal   Collection Time: 03/04/16 11:46 PM  Result Value Ref Range Status   Specimen Description URINE, RANDOM  Final   Special Requests NONE  Final   Culture MULTIPLE SPECIES PRESENT, SUGGEST  RECOLLECTION (A)  Final   Report Status 03/06/2016 FINAL  Final  Culture, expectorated sputum-assessment     Status: None   Collection Time: 03/06/16  8:30 AM  Result Value Ref Range Status   Specimen Description SPU  Final   Special Requests NONE  Final   Sputum evaluation THIS SPECIMEN IS ACCEPTABLE FOR SPUTUM CULTURE  Final   Report Status 03/06/2016 FINAL  Final  Culture, respiratory (NON-Expectorated)     Status: None   Collection Time: 03/06/16  8:30 AM  Result Value Ref Range Status   Specimen Description SPU  Final   Special Requests NONE Reflexed from [id number]  Final   Gram Stain   Final    ABUNDANT WBC PRESENT, PREDOMINANTLY PMN FEW SQUAMOUS EPITHELIAL CELLS PRESENT FEW GRAM POSITIVE COCCI FEW GRAM POSITIVE RODS RARE GRAM NEGATIVE RODS    Culture   Final    Consistent with normal respiratory flora. Performed at [facility]    Report Status 03/08/2016 FINAL  Final     Code Status History    Date Active Date Inactive Code Status Order ID Comments User Context   03/06/2016  1:59 PM 03/08/2016  2:31 PM DNR [id number]  Basilio Cairo, NP Inpatient   03/05/2016  1:36 AM 03/06/2016  1:59 PM Full Code [id number]  Harvie Bridge, DO Inpatient    Questions for Most Recent Historical Code Status (Order [id number])    Question Answer Comment   In the event of cardiac or respiratory ARREST Do not call a "code blue"    In the event of cardiac or respiratory ARREST Do not perform Intubation, CPR, defibrillation or ACLS    In the event of cardiac or respiratory ARREST Use medication by any route, position, wound care, and other measures to relive pain and suffering. May use oxygen, suction and manual treatment of airway obstruction as needed for comfort.           Follow-up Information    KHAN,SAADAT A, MD Follow up in 7 day(s).   Specialty:  Internal Medicine Contact information: [address] [place] [address] [phone]           Discharge  Medications     Medication List    TAKE these medications   BREO ELLIPTA 100-25 MCG/INH Aepb Generic drug:  fluticasone furoate-vilanterol Take 1 puff by mouth daily.   COMBIVENT RESPIMAT 20-100 MCG/ACT Aers respimat Generic drug:  Ipratropium-Albuterol Take 1 puff by mouth 4 (four) times daily.   ipratropium-albuterol 0.5-2.5 (3) MG/3ML Soln Commonly known as:  DUONEB Take 3 mLs by nebulization every 4 (four) hours as needed (shortness of breath).   guaiFENesin 600 MG 12 hr tablet Commonly known as:  MUCINEX Take 1 tablet (600 mg total) by mouth 2 (two) times daily.   guaiFENesin-codeine 100-10 MG/5ML syrup Take 10 mLs by mouth 3 (three) times daily as needed for cough.   HYDROcodone-acetaminophen 5-325 MG tablet Commonly known as:  NORCO/VICODIN Take 1-2 tablets by mouth every 4 (four) hours as needed for moderate pain.   ibuprofen 200 MG tablet Commonly known as:  ADVIL,MOTRIN Take 200 mg by mouth every 6 (six) hours as needed for fever or mild pain.   INCRUSE ELLIPTA 62.5 MCG/INH Aepb Generic drug:  umeclidinium bromide Take 1 puff by mouth daily.   PROAIR HFA 108 (90 Base) MCG/ACT inhaler Generic drug:  albuterol Take 2 puffs by mouth every 4 (four) hours as needed.   zolpidem 5 MG tablet Commonly known as:  AMBIEN Take 1 tablet (5 mg total) by mouth at bedtime as needed for sleep.     ASK your doctor about these medications   levofloxacin 500 MG tablet Commonly known as:  LEVAQUIN Take 1 tablet (500 mg total) by mouth daily. Ask about: Should I take this medication?          Total Time in preparing paper work, data evaluation and todays exam - 35 minutes  Dustin Flock M.D on 03/14/2016 at 2:47 PM  [facility]   Office  [phone]

## 2016-08-13 ENCOUNTER — Ambulatory Visit: Payer: Self-pay | Admitting: Nurse Practitioner

## 2016-08-14 ENCOUNTER — Encounter: Payer: Self-pay | Admitting: Nurse Practitioner

## 2016-08-14 ENCOUNTER — Ambulatory Visit (INDEPENDENT_AMBULATORY_CARE_PROVIDER_SITE_OTHER): Payer: Medicaid Other | Admitting: Nurse Practitioner

## 2016-08-14 VITALS — BP 134/61 | HR 93 | Temp 98.4°F | Resp 16 | Ht 62.0 in | Wt 148.2 lb

## 2016-08-14 DIAGNOSIS — Z7189 Other specified counseling: Secondary | ICD-10-CM

## 2016-08-14 DIAGNOSIS — F419 Anxiety disorder, unspecified: Secondary | ICD-10-CM | POA: Diagnosis not present

## 2016-08-14 DIAGNOSIS — J42 Unspecified chronic bronchitis: Secondary | ICD-10-CM | POA: Diagnosis not present

## 2016-08-14 DIAGNOSIS — Z720 Tobacco use: Secondary | ICD-10-CM | POA: Diagnosis not present

## 2016-08-14 MED ORDER — BUSPIRONE HCL 5 MG PO TABS: 5.0000 mg | ORAL_TABLET | Freq: Three times a day (TID) | ORAL | 2 refills | Status: DC

## 2016-08-14 NOTE — Progress Notes (Addendum)
Subjective:    Patient ID: Patty Coleman, female    DOB: Dec 26, 1960, 56 y.o.   MRN: [id number]  Patty Coleman is a 56 y.o. female presenting on 08/14/2016 for New Patient (Initial Visit)   HPI  COPD  Patient sees Dr. Early Chars with [facility]mon[facility] for management of COPD.  Last pneumonia was in November and she required hospitalization. Patient's husband is requesting Rx for pharmacy hold on prescription for levaquin and prednisone so they have it when she feels like she has pneumonia again. After hospitalization, pt has had increased need for home O2 and is using 4.5-5 L/min now.  Dr. Chancy Milroy orders increase O2 flow as needed with increased activity. Capacity to use 10 L on concentrator.  They seek a new PCP today because Dr. Chancy Milroy has requested PCP be the contact for palliative care management.  Pt and husband are currently working with Hospice and [place] for advanced directives and other palliative needs. Have current DNR order and paper.  Verbalize desire to renew the DNR order.  Verbalize understanding of DNR status to withhold intubation, CPR, defibrillation, and ACLS.   Still smokes 1.25-1.5 ppd. Has tried Chantix, ecigs, wellbutrin, patches in past with no success.  Lower levels of smoking when previously on antianxiety medication (xanax bid).    When she lies down in bed muscles from back to belly get tight.  Incline bed.  Sometimes when upright. Shoulders stay tense. Makes it hard to sleep/breathe.  Anxiety Pt has had anxiety in past and is having difficulty now with worrying about things, feeling anxious and on edge, and being easily annoyed/irritated.  This does impact her relationship with her husband some, but it makes her smoking worse. Smoking is the primary reason for seeking treatment.  Pt stays home to limit getting sick, which reduces social support.  Good family support system.  Pt has taken xanax 0.5 mg twice daily  for anxiety in the past.  Pt and husband express frustration with not being able to find a doctor who will prescribe this and want to try multiple antidepressants. They are not able to discuss what has been tried in the interim, but have not yet tried buspirone.  Patient's husband has negative perception of use of antidepressants and does not want to use them. Pt agrees with plan to proceed with buspirone and pt's husband does state that it is pt's decision.  GAD 7 : Generalized Anxiety Score 08/14/2016  Nervous, Anxious, on Edge 3  Control/stop worrying 3  Worry too much - different things 3  Trouble relaxing 3  Restless 0  Easily annoyed or irritable 3  Afraid - awful might happen 1  Total GAD 7 Score 16  Anxiety Difficulty Somewhat difficult   Depression screen PHQ 2/9 08/14/2016  Decreased Interest 0  Down, Depressed, Hopeless 0  PHQ - 2 Score 0  Altered sleeping 3  Tired, decreased energy 3  Change in appetite 0  Feeling bad or failure about yourself  0  Trouble concentrating 0  Moving slowly or fidgety/restless 0  Suicidal thoughts 0  PHQ-9 Score 6     Past Medical History:  Diagnosis Date  . Cancer ([place])   . COPD (chronic obstructive pulmonary disease) ([place])   . Hypercholesteremia    History reviewed. No pertinent surgical history. Social History   Social History  . Marital status: Married    Spouse name: N/A  . Number of children: N/A  . Years of education:  N/A   Occupational History  . Not on file.   Social History Main Topics  . Smoking status: Current Every Day Smoker    Packs/day: 1.50    Years: 35.00    Types: Cigarettes  . Smokeless tobacco: Never Used  . Alcohol use No  . Drug use: No  . Sexual activity: Not on file   Other Topics Concern  . Not on file   Social History Narrative  . No narrative on file   Family History  Problem Relation Age of Onset  . COPD Mother   . Heart disease Father    Current Outpatient Prescriptions on File Prior  to Visit  Medication Sig  . BREO ELLIPTA 100-25 MCG/INH AEPB Take 1 puff by mouth daily.  . COMBIVENT RESPIMAT 20-100 MCG/ACT AERS respimat Take 1 puff by mouth 4 (four) times daily.  Marland Kitchen guaiFENesin (MUCINEX) 600 MG 12 hr tablet Take 1 tablet (600 mg total) by mouth 2 (two) times daily.  Marland Kitchen ibuprofen (ADVIL,MOTRIN) 200 MG tablet Take 200 mg by mouth every 6 (six) hours as needed for fever or mild pain.  . INCRUSE ELLIPTA 62.5 MCG/INH AEPB Take 1 puff by mouth daily.  Marland Kitchen ipratropium-albuterol (DUONEB) 0.5-2.5 (3) MG/3ML SOLN Take 3 mLs by nebulization every 4 (four) hours as needed (shortness of breath).  Marland Kitchen PROAIR HFA 108 (90 Base) MCG/ACT inhaler Take 2 puffs by mouth every 4 (four) hours as needed.   No current facility-administered medications on file prior to visit.     Review of Systems  Constitutional: Negative.   HENT: Negative.   Eyes: Negative.   Cardiovascular: Negative for chest pain and palpitations.  Gastrointestinal: Positive for constipation.  Endocrine: Negative.   Genitourinary: Negative.   Musculoskeletal: Negative.   Skin: Negative.   Allergic/Immunologic: Negative.   Neurological: Positive for tremors.  Hematological: Negative.   Psychiatric/Behavioral: Negative.    Per HPI unless specifically indicated above      Objective:    BP 134/61 (BP Location: Right Arm, Patient Position: Sitting, Cuff Size: Large)   Pulse 93   Temp 98.4 F (36.9 C) (Oral)   Resp 16   Ht 5\' 2"  (1.575 m)   Wt 148 lb 3.2 oz (67.2 kg)   SpO2 99%   BMI 27.11 kg/m    Wt Readings from Last 3 Encounters:  08/14/16 148 lb 3.2 oz (67.2 kg)  03/04/16 161 lb (73 kg)    Physical Exam  Constitutional: She is oriented to person, place, and time. She appears well-developed and well-nourished.  HENT:  Head: Normocephalic and atraumatic.  Right Ear: External ear normal.  Left Ear: External ear normal.  Nose: Nose normal.  Mouth/Throat: Oropharynx is clear and moist.  Eyes: Conjunctivae  and EOM are normal. Pupils are equal, round, and reactive to light.  Neck: Normal range of motion. No JVD present. No tracheal deviation present.  Cardiovascular: Normal rate, regular rhythm, normal heart sounds and intact distal pulses.   RLE edema +2 pitting  Pulmonary/Chest: Tachypnea noted. She has decreased breath sounds in the right upper field, the right lower field, the left upper field and the left lower field. She has no wheezes. She has no rhonchi. She has no rales.  Minimally increased respiratory effort  Abdominal: Soft. Bowel sounds are normal.  Lymphadenopathy:    She has no cervical adenopathy.  Neurological: She is alert and oriented to person, place, and time.  Tremor at rest  Skin: Skin is warm and dry.  Psychiatric:  Her speech is normal. Judgment and thought content normal. Her mood appears anxious. She is hyperactive. She expresses no homicidal and no suicidal ideation. She expresses no suicidal plans and no homicidal plans.  Vitals reviewed.     Assessment & Plan:   Problem List Items Addressed This Visit      Respiratory   COPD (chronic obstructive pulmonary disease) ([place])    Severe disease. Seemingly stable at this time.  On home O2 since 10/2012, followed by pulmonary, 50 pack year smoking history, PFTs 12/14 with FEV1 of 37% and greatly reduced DLCO.  Currently receiving services from palliative care.  Plan: 1. Continue medical management with Dr. Humphrey Rolls. 2. Continue work with palliative care and current work with advanced directives. 3. Renewed chart DNR order for outpatient setting.      Relevant Orders   DNR (Do Not Resuscitate)     Other   DNR (do not resuscitate) discussion    Patient and husband verbalize understanding of DNR.  Current order in place.  Verbalized desire to renew DNR order.  Order placed in chart, paper order completed.      Anxiety - Primary    Active and ongoing problem.  Previously managed with xanax 0.5 bid.  Willing to try  buspar first.  Pt and husband argue against, but patient agrees to try this first.  Plan: 1. Manage anxiety in effort to reduce tobacco use. 2. START buspar 5 mg once daily for 1 week.  Increase to twice daily for 1 week.  Then take three times per day. If effective at lower dose, may take lower dose. 3. Follow up 4 weeks for new medication assessment.       Relevant Medications   busPIRone (BUSPAR) 5 MG tablet   Tobacco use    Pt with previous attempts to quit unsucessfully.  Plan: 1. Goal to reduce overall smoking with management of anxiety.         No orders of the defined types were placed in this encounter.     Follow up plan: No Follow-up on file.  Cassell Smiles, DNP, AGPCNP-BC Adult Gerontology Primary Care Nurse Practitioner [facility] 08/14/2016, 3:33 PM

## 2016-08-14 NOTE — Assessment & Plan Note (Signed)
Patient and husband verbalize understanding of DNR.  Current order in place.  Verbalized desire to renew DNR order.  Order placed in chart, paper order completed.

## 2016-08-14 NOTE — Assessment & Plan Note (Addendum)
Active and ongoing problem.  Previously managed with xanax 0.5 bid.  Willing to try buspar first.  Pt and husband argue against, but patient agrees to try this first.  Plan: 1. Manage anxiety in effort to reduce tobacco use. 2. START buspar 5 mg once daily for 1 week.  Increase to twice daily for 1 week.  Then take three times per day. If effective at lower dose, may take lower dose. 3. Follow up 4 weeks for new medication assessment.

## 2016-08-14 NOTE — Patient Instructions (Signed)
Patty Coleman, Thank you for coming in to clinic today.  1. For your COPD, continue working with your pulmonology doctor.    2. For your anxiety, START buspar 5 mg once daily for 1 week.  Increase to two doses daily for 1 week.  Then, increase to 3 doses daily and continue. You may stop at any dose that is effective.  Please schedule a follow-up appointment with Cassell Smiles, AGNP in 4 weeks.  If you have any other questions or concerns, please feel free to call the clinic or send a message through [place]. You may also schedule an earlier appointment if necessary.  Cassell Smiles, DNP, AGNP-BC Adult Gerontology Nurse Practitioner [facility], [facility]    Buspirone tablets What is this medicine? BUSPIRONE (byoo SPYE rone) is used to treat anxiety disorders. This medicine may be used for other purposes; ask your health care provider or pharmacist if you have questions. COMMON BRAND NAME(S): BuSpar What should I tell my health care provider before I take this medicine? They need to know if you have any of these conditions: -kidney or liver disease -an unusual or allergic reaction to buspirone, other medicines, foods, dyes, or preservatives -pregnant or trying to get pregnant -breast-feeding How should I use this medicine? Take this medicine by mouth with a glass of water. Follow the directions on the prescription label. You may take this medicine with or without food. To ensure that this medicine always works the same way for you, you should take it either always with or always without food. Take your doses at regular intervals. Do not take your medicine more often than directed. Do not stop taking except on the advice of your doctor or health care professional. Talk to your pediatrician regarding the use of this medicine in children. Special care may be needed. Overdosage: If you think you have taken too much of this medicine contact a poison control center or emergency room at  once. NOTE: This medicine is only for you. Do not share this medicine with others. What if I miss a dose? If you miss a dose, take it as soon as you can. If it is almost time for your next dose, take only that dose. Do not take double or extra doses. What may interact with this medicine? Do not take this medicine with any of the following medications: -linezolid -MAOIs like Carbex, Eldepryl, Marplan, Nardil, and Parnate -methylene blue -procarbazine This medicine may also interact with the following medications: -diazepam -digoxin -diltiazem -erythromycin -grapefruit juice -haloperidol -medicines for mental depression or mood problems -medicines for seizures like carbamazepine, phenobarbital and phenytoin -nefazodone -other medications for anxiety -rifampin -ritonavir -some antifungal medicines like itraconazole, ketoconazole, and voriconazole -verapamil -warfarin This list may not describe all possible interactions. Give your health care provider a list of all the medicines, herbs, non-prescription drugs, or dietary supplements you use. Also tell them if you smoke, drink alcohol, or use illegal drugs. Some items may interact with your medicine. What should I watch for while using this medicine? Visit your doctor or health care professional for regular checks on your progress. It may take 1 to 2 weeks before your anxiety gets better. You may get drowsy or dizzy. Do not drive, use machinery, or do anything that needs mental alertness until you know how this drug affects you. Do not stand or sit up quickly, especially if you are an older patient. This reduces the risk of dizzy or fainting spells. Alcohol can make you more drowsy  and dizzy. Avoid alcoholic drinks. What side effects may I notice from receiving this medicine? Side effects that you should report to your doctor or health care professional as soon as possible: -blurred vision or other vision changes -chest  pain -confusion -difficulty breathing -feelings of hostility or anger -muscle aches and pains -numbness or tingling in hands or feet -ringing in the ears -skin rash and itching -vomiting -weakness Side effects that usually do not require medical attention (report to your doctor or health care professional if they continue or are bothersome): -disturbed dreams, nightmares -headache -nausea -restlessness or nervousness -sore throat and nasal congestion -stomach upset This list may not describe all possible side effects. Call your doctor for medical advice about side effects. You may report side effects to FDA at 1-800-FDA-1088. Where should I keep my medicine? Keep out of the reach of children. Store at room temperature below 30 degrees C (86 degrees F). Protect from light. Keep container tightly closed. Throw away any unused medicine after the expiration date. NOTE: This sheet is a summary. It may not cover all possible information. If you have questions about this medicine, talk to your doctor, pharmacist, or health care provider.  2018 Elsevier/Gold Standard (2009-11-07 18:06:11)

## 2016-08-14 NOTE — Assessment & Plan Note (Signed)
Severe disease. Seemingly stable at this time.  On home O2 since 10/2012, followed by pulmonary, 50 pack year smoking history, PFTs 12/14 with FEV1 of 37% and greatly reduced DLCO.  Currently receiving services from palliative care.  Plan: 1. Continue medical management with Dr. Humphrey Rolls. 2. Continue work with palliative care and current work with advanced directives. 3. Renewed chart DNR order for outpatient setting.

## 2016-08-15 NOTE — Assessment & Plan Note (Signed)
Pt with previous attempts to quit unsucessfully.  Plan: 1. Goal to reduce overall smoking with management of anxiety.

## 2016-08-16 NOTE — Progress Notes (Signed)
I have reviewed this encounter including the documentation in this note and/or discussed this patient with the provider, Cassell Smiles, AGPCNP-BC. I am certifying that I agree with the content of this note as supervising physician.  Nobie Putnam, [facility] 08/16/2016, 10:10 AM

## 2016-09-15 ENCOUNTER — Ambulatory Visit: Payer: Self-pay | Admitting: Nurse Practitioner

## 2016-09-22 ENCOUNTER — Ambulatory Visit: Payer: Self-pay | Admitting: Nurse Practitioner

## 2016-11-17 ENCOUNTER — Emergency Department: Payer: Medicaid Other

## 2016-11-17 ENCOUNTER — Encounter: Payer: Self-pay | Admitting: Emergency Medicine

## 2016-11-17 ENCOUNTER — Inpatient Hospital Stay: Payer: Medicaid Other

## 2016-11-17 ENCOUNTER — Inpatient Hospital Stay
Admission: EM | Admit: 2016-11-17 | Discharge: 2016-11-23 | DRG: 208 | Disposition: A | Discharge status: Hospice | Payer: Medicaid Other | Attending: Internal Medicine | Admitting: Internal Medicine

## 2016-11-17 DIAGNOSIS — E872 Acidosis: Secondary | ICD-10-CM | POA: Diagnosis present

## 2016-11-17 DIAGNOSIS — Z825 Family history of asthma and other chronic lower respiratory diseases: Secondary | ICD-10-CM | POA: Diagnosis not present

## 2016-11-17 DIAGNOSIS — Z66 Do not resuscitate: Secondary | ICD-10-CM | POA: Diagnosis present

## 2016-11-17 DIAGNOSIS — F411 Generalized anxiety disorder: Secondary | ICD-10-CM | POA: Diagnosis present

## 2016-11-17 DIAGNOSIS — Z9981 Dependence on supplemental oxygen: Secondary | ICD-10-CM

## 2016-11-17 DIAGNOSIS — G9341 Metabolic encephalopathy: Secondary | ICD-10-CM | POA: Diagnosis present

## 2016-11-17 DIAGNOSIS — E7800 Pure hypercholesterolemia, unspecified: Secondary | ICD-10-CM | POA: Diagnosis present

## 2016-11-17 DIAGNOSIS — Z7189 Other specified counseling: Secondary | ICD-10-CM | POA: Diagnosis not present

## 2016-11-17 DIAGNOSIS — R739 Hyperglycemia, unspecified: Secondary | ICD-10-CM | POA: Diagnosis present

## 2016-11-17 DIAGNOSIS — Z6826 Body mass index (BMI) 26.0-26.9, adult: Secondary | ICD-10-CM

## 2016-11-17 DIAGNOSIS — R32 Unspecified urinary incontinence: Secondary | ICD-10-CM | POA: Diagnosis present

## 2016-11-17 DIAGNOSIS — J9622 Acute and chronic respiratory failure with hypercapnia: Principal | ICD-10-CM | POA: Diagnosis present

## 2016-11-17 DIAGNOSIS — D696 Thrombocytopenia, unspecified: Secondary | ICD-10-CM | POA: Diagnosis present

## 2016-11-17 DIAGNOSIS — F17210 Nicotine dependence, cigarettes, uncomplicated: Secondary | ICD-10-CM | POA: Diagnosis present

## 2016-11-17 DIAGNOSIS — R609 Edema, unspecified: Secondary | ICD-10-CM

## 2016-11-17 DIAGNOSIS — F329 Major depressive disorder, single episode, unspecified: Secondary | ICD-10-CM | POA: Diagnosis present

## 2016-11-17 DIAGNOSIS — J441 Chronic obstructive pulmonary disease with (acute) exacerbation: Secondary | ICD-10-CM | POA: Diagnosis present

## 2016-11-17 DIAGNOSIS — Z8249 Family history of ischemic heart disease and other diseases of the circulatory system: Secondary | ICD-10-CM

## 2016-11-17 DIAGNOSIS — Z90710 Acquired absence of both cervix and uterus: Secondary | ICD-10-CM | POA: Diagnosis not present

## 2016-11-17 DIAGNOSIS — Z8541 Personal history of malignant neoplasm of cervix uteri: Secondary | ICD-10-CM

## 2016-11-17 DIAGNOSIS — J9601 Acute respiratory failure with hypoxia: Secondary | ICD-10-CM | POA: Diagnosis not present

## 2016-11-17 DIAGNOSIS — R0602 Shortness of breath: Secondary | ICD-10-CM | POA: Diagnosis present

## 2016-11-17 DIAGNOSIS — Z515 Encounter for palliative care: Secondary | ICD-10-CM | POA: Diagnosis present

## 2016-11-17 DIAGNOSIS — I959 Hypotension, unspecified: Secondary | ICD-10-CM | POA: Diagnosis present

## 2016-11-17 DIAGNOSIS — R0600 Dyspnea, unspecified: Secondary | ICD-10-CM

## 2016-11-17 DIAGNOSIS — J9600 Acute respiratory failure, unspecified whether with hypoxia or hypercapnia: Secondary | ICD-10-CM

## 2016-11-17 DIAGNOSIS — E871 Hypo-osmolality and hyponatremia: Secondary | ICD-10-CM | POA: Diagnosis present

## 2016-11-17 DIAGNOSIS — E669 Obesity, unspecified: Secondary | ICD-10-CM | POA: Diagnosis present

## 2016-11-17 DIAGNOSIS — R4182 Altered mental status, unspecified: Secondary | ICD-10-CM

## 2016-11-17 DIAGNOSIS — J9621 Acute and chronic respiratory failure with hypoxia: Secondary | ICD-10-CM | POA: Diagnosis present

## 2016-11-17 LAB — CBC WITH DIFFERENTIAL/PLATELET
Basophils Absolute: 0 10*3/uL (ref 0–0.1)
Basophils Relative: 0 %
EOS ABS: 0.1 10*3/uL (ref 0–0.7)
Eosinophils Relative: 1 %
HCT: 35 % (ref 35.0–47.0)
HEMOGLOBIN: 11.4 g/dL — AB (ref 12.0–16.0)
LYMPHS ABS: 0.7 10*3/uL — AB (ref 1.0–3.6)
LYMPHS PCT: 7 %
MCH: 28.9 pg (ref 26.0–34.0)
MCHC: 32.6 g/dL (ref 32.0–36.0)
MCV: 88.6 fL (ref 80.0–100.0)
Monocytes Absolute: 0.3 10*3/uL (ref 0.2–0.9)
Monocytes Relative: 4 %
NEUTROS PCT: 88 %
Neutro Abs: 7.9 10*3/uL — ABNORMAL HIGH (ref 1.4–6.5)
Platelets: 197 10*3/uL (ref 150–440)
RBC: 3.95 MIL/uL (ref 3.80–5.20)
RDW: 13.9 % (ref 11.5–14.5)
WBC: 9.1 10*3/uL (ref 3.6–11.0)

## 2016-11-17 LAB — BLOOD GAS, ARTERIAL
Acid-Base Excess: 11.7 mmol/L — ABNORMAL HIGH (ref 0.0–2.0)
Bicarbonate: 40.9 mmol/L — ABNORMAL HIGH (ref 20.0–28.0)
FIO2: 0.45
MECHANICAL RATE: 18
MECHVT: 500 mL
O2 Saturation: 96.3 %
PATIENT TEMPERATURE: 37
PEEP: 5 cmH2O
PO2 ART: 95 mmHg (ref 83.0–108.0)
RATE: 18 resp/min
pCO2 arterial: 89 mmHg (ref 32.0–48.0)
pH, Arterial: 7.27 — ABNORMAL LOW (ref 7.350–7.450)

## 2016-11-17 LAB — COMPREHENSIVE METABOLIC PANEL
ALBUMIN: 4.3 g/dL (ref 3.5–5.0)
ALK PHOS: 54 U/L (ref 38–126)
ALT: 7 U/L — AB (ref 14–54)
AST: 16 U/L (ref 15–41)
Anion gap: 9 (ref 5–15)
BUN: 15 mg/dL (ref 6–20)
CALCIUM: 9 mg/dL (ref 8.9–10.3)
CO2: 44 mmol/L — AB (ref 22–32)
CREATININE: 0.47 mg/dL (ref 0.44–1.00)
Chloride: 80 mmol/L — ABNORMAL LOW (ref 101–111)
GFR calc Af Amer: >60 mL/min (ref >60)
GFR calc non Af Amer: >60 mL/min (ref >60)
GLUCOSE: 157 mg/dL — AB (ref 65–99)
Potassium: 3.9 mmol/L (ref 3.5–5.1)
SODIUM: 133 mmol/L — AB (ref 135–145)
Total Bilirubin: 0.7 mg/dL (ref 0.3–1.2)
Total Protein: 7.4 g/dL (ref 6.5–8.1)

## 2016-11-17 LAB — URINALYSIS, COMPLETE (UACMP) WITH MICROSCOPIC
BACTERIA UA: NONE SEEN
BILIRUBIN URINE: NEGATIVE
GLUCOSE, UA: 50 mg/dL — AB
Ketones, ur: 20 mg/dL — AB
LEUKOCYTES UA: NEGATIVE
NITRITE: NEGATIVE
PH: 8 (ref 5.0–8.0)
Protein, ur: 100 mg/dL — AB
SPECIFIC GRAVITY, URINE: 1.011 (ref 1.005–1.030)
Squamous Epithelial / LPF: NONE SEEN

## 2016-11-17 LAB — TROPONIN I

## 2016-11-17 LAB — MRSA PCR SCREENING: MRSA by PCR: NEGATIVE

## 2016-11-17 LAB — LACTIC ACID, PLASMA
Lactic Acid, Venous: 0.8 mmol/L (ref 0.5–1.9)
Lactic Acid, Venous: 2.3 mmol/L (ref 0.5–1.9)

## 2016-11-17 LAB — GLUCOSE, CAPILLARY
Glucose-Capillary: 96 mg/dL (ref 65–99)
Glucose-Capillary: 107 mg/dL — ABNORMAL HIGH (ref 65–99)

## 2016-11-17 LAB — BRAIN NATRIURETIC PEPTIDE: B Natriuretic Peptide: 79 pg/mL (ref 0.0–100.0)

## 2016-11-17 LAB — PROTIME-INR
INR: 0.9
PROTHROMBIN TIME: 12.1 s (ref 11.4–15.2)

## 2016-11-17 LAB — LIPASE, BLOOD: Lipase: 22 U/L (ref 11–51)

## 2016-11-17 LAB — PROCALCITONIN: Procalcitonin: <0.1 ng/mL

## 2016-11-17 LAB — ETHANOL: Alcohol, Ethyl (B): <5 mg/dL (ref <5)

## 2016-11-17 LAB — ACETAMINOPHEN LEVEL: Acetaminophen (Tylenol), Serum: <10 ug/mL — ABNORMAL LOW (ref 10–30)

## 2016-11-17 MED ORDER — CHLORHEXIDINE GLUCONATE 0.12% ORAL RINSE (MEDLINE KIT)
15.0000 mL | Freq: Two times a day (BID) | OROMUCOSAL | Status: DC
  Administered 2016-11-18 – 2016-11-19 (×3): 15 mL via OROMUCOSAL

## 2016-11-17 MED ORDER — ONDANSETRON HCL 4 MG/2ML IJ SOLN: 4.0000 mg | Freq: Four times a day (QID) | INTRAMUSCULAR | Status: DC | PRN

## 2016-11-17 MED ORDER — ACETAMINOPHEN 325 MG PO TABS
650.0000 mg | ORAL_TABLET | Freq: Four times a day (QID) | ORAL | Status: DC | PRN
  Administered 2016-11-20: 650 mg via ORAL
  Filled 2016-11-17: qty 2

## 2016-11-17 MED ORDER — ACETAMINOPHEN 650 MG RE SUPP: 650.0000 mg | Freq: Four times a day (QID) | RECTAL | Status: DC | PRN

## 2016-11-17 MED ORDER — CHLORHEXIDINE GLUCONATE 0.12 % MT SOLN
15.0000 mL | Freq: Two times a day (BID) | OROMUCOSAL | Status: DC
  Administered 2016-11-17: 15 mL via OROMUCOSAL

## 2016-11-17 MED ORDER — IPRATROPIUM-ALBUTEROL 0.5-2.5 (3) MG/3ML IN SOLN
3.0000 mL | Freq: Once | RESPIRATORY_TRACT | Status: AC
  Administered 2016-11-17: 3 mL via RESPIRATORY_TRACT

## 2016-11-17 MED ORDER — METHYLPREDNISOLONE SODIUM SUCC 125 MG IJ SOLR
60.0000 mg | Freq: Four times a day (QID) | INTRAMUSCULAR | Status: DC
  Administered 2016-11-17 – 2016-11-21 (×15): 60 mg via INTRAVENOUS
  Filled 2016-11-17 (×15): qty 2

## 2016-11-17 MED ORDER — ORAL CARE MOUTH RINSE
15.0000 mL | OROMUCOSAL | Status: DC
  Administered 2016-11-17 – 2016-11-19 (×18): 15 mL via OROMUCOSAL

## 2016-11-17 MED ORDER — FENTANYL CITRATE (PF) 100 MCG/2ML IJ SOLN
100.0000 ug | Freq: Once | INTRAMUSCULAR | Status: AC
  Administered 2016-11-17: 100 ug via INTRAVENOUS

## 2016-11-17 MED ORDER — MIDAZOLAM BOLUS VIA INFUSION
1.0000 mg | Freq: Once | INTRAVENOUS | Status: AC
  Administered 2016-11-17: 1 mg via INTRAVENOUS
  Filled 2016-11-17: qty 1

## 2016-11-17 MED ORDER — HYDROCODONE-ACETAMINOPHEN 5-325 MG PO TABS
1.0000 | ORAL_TABLET | ORAL | Status: DC | PRN
  Administered 2016-11-19 – 2016-11-22 (×2): 1 via ORAL
  Filled 2016-11-17 (×2): qty 1

## 2016-11-17 MED ORDER — ORAL CARE MOUTH RINSE: 15.0000 mL | Freq: Two times a day (BID) | OROMUCOSAL | Status: DC

## 2016-11-17 MED ORDER — BISACODYL 5 MG PO TBEC
5.0000 mg | DELAYED_RELEASE_TABLET | Freq: Every day | ORAL | Status: DC | PRN
  Administered 2016-11-21: 14:00:00 5 mg via ORAL
  Filled 2016-11-17: qty 1

## 2016-11-17 MED ORDER — DOCUSATE SODIUM 100 MG PO CAPS
100.0000 mg | ORAL_CAPSULE | Freq: Two times a day (BID) | ORAL | Status: DC
  Administered 2016-11-18 – 2016-11-23 (×7): 100 mg via ORAL
  Filled 2016-11-17 (×7): qty 1

## 2016-11-17 MED ORDER — ONDANSETRON HCL 4 MG PO TABS: 4.0000 mg | ORAL_TABLET | Freq: Four times a day (QID) | ORAL | Status: DC | PRN

## 2016-11-17 MED ORDER — FAMOTIDINE IN NACL 20-0.9 MG/50ML-% IV SOLN
20.0000 mg | Freq: Two times a day (BID) | INTRAVENOUS | Status: DC
  Administered 2016-11-17 – 2016-11-23 (×12): 20 mg via INTRAVENOUS
  Filled 2016-11-17 (×14): qty 50

## 2016-11-17 MED ORDER — TRAZODONE HCL 50 MG PO TABS
25.0000 mg | ORAL_TABLET | Freq: Every evening | ORAL | Status: DC | PRN
  Administered 2016-11-20 (×2): 25 mg via ORAL
  Filled 2016-11-17 (×2): qty 1

## 2016-11-17 MED ORDER — METHYLPREDNISOLONE SODIUM SUCC 125 MG IJ SOLR: 125.0000 mg | Freq: Once | INTRAMUSCULAR | Status: DC

## 2016-11-17 MED ORDER — SODIUM CHLORIDE 0.9 % IV SOLN
0.0000 ug/min | INTRAVENOUS | Status: DC
  Administered 2016-11-17: 40 ug/min via INTRAVENOUS
  Administered 2016-11-17: 20 ug/min via INTRAVENOUS
  Filled 2016-11-17 (×2): qty 1

## 2016-11-17 MED ORDER — SODIUM CHLORIDE 0.9 % IV SOLN
1.0000 mg/h | INTRAVENOUS | Status: DC
  Administered 2016-11-17: 0.5 mg/h via INTRAVENOUS
  Filled 2016-11-17: qty 10

## 2016-11-17 MED ORDER — SODIUM CHLORIDE 0.9 % IV SOLN: 10.0000 ug/h | INTRAVENOUS | Status: DC

## 2016-11-17 MED ORDER — MIDAZOLAM HCL 2 MG/2ML IJ SOLN
2.0000 mg | Freq: Once | INTRAMUSCULAR | Status: AC
  Administered 2016-11-17: 2 mg via INTRAVENOUS

## 2016-11-17 MED ORDER — FENTANYL 2500MCG IN NS 250ML (10MCG/ML) PREMIX INFUSION
25.0000 ug/h | INTRAVENOUS | Status: DC
  Administered 2016-11-17: 10 ug/h via INTRAVENOUS
  Administered 2016-11-17: 100 ug/h via INTRAVENOUS
  Administered 2016-11-19: 25 ug/h via INTRAVENOUS
  Filled 2016-11-17 (×2): qty 250

## 2016-11-17 MED ORDER — IPRATROPIUM-ALBUTEROL 0.5-2.5 (3) MG/3ML IN SOLN
3.0000 mL | RESPIRATORY_TRACT | Status: DC
  Administered 2016-11-17 – 2016-11-20 (×16): 3 mL via RESPIRATORY_TRACT
  Filled 2016-11-17 (×16): qty 3

## 2016-11-17 MED ORDER — ENOXAPARIN SODIUM 40 MG/0.4ML ~~LOC~~ SOLN
40.0000 mg | SUBCUTANEOUS | Status: DC
  Administered 2016-11-17 – 2016-11-22 (×6): 40 mg via SUBCUTANEOUS
  Filled 2016-11-17 (×6): qty 0.4

## 2016-11-17 MED ORDER — IPRATROPIUM-ALBUTEROL 0.5-2.5 (3) MG/3ML IN SOLN: 3.0000 mL | RESPIRATORY_TRACT | Status: DC | PRN

## 2016-11-17 MED ORDER — SODIUM CHLORIDE 0.9 % IV SOLN
INTRAVENOUS | Status: DC
  Administered 2016-11-17 – 2016-11-23 (×9): via INTRAVENOUS

## 2016-11-17 MED ORDER — IPRATROPIUM-ALBUTEROL 0.5-2.5 (3) MG/3ML IN SOLN
3.0000 mL | Freq: Once | RESPIRATORY_TRACT | Status: AC
  Administered 2016-11-17: 3 mL via RESPIRATORY_TRACT
  Filled 2016-11-17: qty 9

## 2016-11-17 MED ORDER — ETOMIDATE 2 MG/ML IV SOLN
10.0000 mg | Freq: Once | INTRAVENOUS | Status: AC
  Administered 2016-11-17: 10 mg via INTRAVENOUS

## 2016-11-17 MED ORDER — SUCCINYLCHOLINE CHLORIDE 20 MG/ML IJ SOLN
20.0000 mg | Freq: Once | INTRAMUSCULAR | Status: AC
  Administered 2016-11-17: 20 mg via INTRAVENOUS

## 2016-11-17 NOTE — Progress Notes (Signed)
Notified [facility] physician of low BP, swollen mottle right ankle, ans tall TWaves with very narrow QRS complexes.  Neo ordered, no other interventions at this time

## 2016-11-17 NOTE — Consult Note (Signed)
PULMONARY / CRITICAL CARE MEDICINE   Name: Patty Coleman MRN: [id number] DOB: December 25, 1960    ADMISSION DATE:  11/17/2016  CONSULTATION DATE: 11/17/16  REFERRING MD: Dr. Sissy Hoff  CHIEF COMPLAINT: Loss of Conciousness  HISTORY OF PRESENT ILLNESS:   Patty Coleman is a 56 yo female with known history of severe COPD uses 4-5 liters of O2, Tobacco abuse and Hypercholesteremia.  Patient was found unresponsive by family members and EMS was called.  When EMS arrived on the scene her O2 sats were down to 50%.  Patient was brought to the ED where she was intubated .  Husband brought her DNR paperwork later and wishes her to be intubated for short period of time.  However in the case of Cardiac arrest he would not want any chest compression. Patient is placed in the ICU for further management.  PAST MEDICAL HISTORY :  She  has a past medical history of Anxiety; Cancer [facility]); COPD (chronic obstructive pulmonary disease) (Gas); Hypercholesteremia; and Urinary incontinence.  PAST SURGICAL HISTORY: She  has a past surgical history that includes Cesarean section and Abdominal hysterectomy.  Allergies  Allergen Reactions  . Sulfa Antibiotics Swelling    angioedema  . Eggs Or Egg-Derived Products Hives and Nausea Only  . Lincocin [Lincomycin Hcl] Hives    No current facility-administered medications on file prior to encounter.    Current Outpatient Prescriptions on File Prior to Encounter  Medication Sig  . BREO ELLIPTA 100-25 MCG/INH AEPB Take 1 puff by mouth daily.  . busPIRone (BUSPAR) 5 MG tablet Take 1 tablet (5 mg total) by mouth 3 (three) times daily.  . COMBIVENT RESPIMAT 20-100 MCG/ACT AERS respimat Take 1 puff by mouth 4 (four) times daily.  Marland Kitchen ibuprofen (ADVIL,MOTRIN) 200 MG tablet Take 200 mg by mouth every 6 (six) hours as needed for fever or mild pain.  . INCRUSE ELLIPTA 62.5 MCG/INH AEPB Take 1 puff by mouth daily.  Marland Kitchen ipratropium-albuterol (DUONEB) 0.5-2.5 (3) MG/3ML  SOLN Take 3 mLs by nebulization every 4 (four) hours as needed (shortness of breath).  Marland Kitchen PROAIR HFA 108 (90 Base) MCG/ACT inhaler Take 2 puffs by mouth every 4 (four) hours as needed.  Marland Kitchen guaiFENesin (MUCINEX) 600 MG 12 hr tablet Take 1 tablet (600 mg total) by mouth 2 (two) times daily. (Patient not taking: Reported on 11/17/2016)    FAMILY HISTORY:  Her indicated that her mother is deceased. She indicated that her father is deceased. She indicated that her brother is alive. She indicated that both of her daughters are alive.    SOCIAL HISTORY: She  reports that she has been smoking Cigarettes.  She has a 43.75 pack-year smoking history. She has never used smokeless tobacco. She reports that she does not drink alcohol or use drugs.  REVIEW OF SYSTEMS:   Unable to obtain  SUBJECTIVE:  Unable to Obtain  VITAL SIGNS: BP 111/67 (BP Location: Left Arm)   Pulse 94   Temp 98.5 F (36.9 C) (Oral)   Resp 19   Ht 5\' 6"  (1.676 m)   Wt 72.2 kg (159 lb 2.8 oz)   SpO2 99%   BMI 25.69 kg/m   HEMODYNAMICS:    VENTILATOR SETTINGS: Vent Mode: PRVC FiO2 (%):  [40 %-55 %] 40 % Set Rate:  [18 bmp-22 bmp] 22 bmp Vt Set:  [500 mL] 500 mL PEEP:  [5 cmH20] 5 cmH20 Plateau Pressure:  [34 cmH20] 34 cmH20  INTAKE / OUTPUT: I/O last 3 completed shifts: In:  91.1 [I.V.:91.1] Out: 670 [Urine:670]  PHYSICAL EXAMINATION: General:Caucasian female, intubated and on mechanical ventilation Neuro: obtunded HEENT: AT,[place], No JVD, Pupils 2 and sluggish Cardiovascular: s1s2,regular, no m/r/g Lungs:  Faint expiratory wheezes, no rhonchi,crackles Abdomen:  Obese,round,NT Musculoskeletal:No edema,cyanosis Skin: warm, dry and intact  LABS:  BMET  Recent Labs Lab 11/17/16 1418  NA 133*  K 3.9  CL 80*  CO2 44*  BUN 15  CREATININE 0.47  GLUCOSE 157*    Electrolytes  Recent Labs Lab 11/17/16 1418  CALCIUM 9.0    CBC  Recent Labs Lab 11/17/16 1418  WBC 9.1  HGB 11.4*  HCT 35.0  PLT  197    Coag's  Recent Labs Lab 11/17/16 1418  INR 0.90    Sepsis Markers  Recent Labs Lab 11/17/16 1418 11/17/16 1720  LATICACIDVEN 0.8 2.3*    ABG  Recent Labs Lab 11/17/16 1708  PHART 7.27*  PCO2ART 89*  PO2ART 95    Liver Enzymes  Recent Labs Lab 11/17/16 1418  AST 16  ALT 7*  ALKPHOS 54  BILITOT 0.7  ALBUMIN 4.3    Cardiac Enzymes  Recent Labs Lab 11/17/16 1418  TROPONINI <0.03    Glucose  Recent Labs Lab 11/17/16 1759  GLUCAP 96    Imaging Dg Abdomen 1 View  Result Date: 11/17/2016 CLINICAL DATA:  Orogastric tube placement EXAM: ABDOMEN - 1 VIEW COMPARISON:  None. FINDINGS: The tip and side port of the orogastric tube are near the gastroduodenal junction, with its tip probably in the distal first portion of the duodenum. Lung bases are clear. Normal cardiomediastinal contours. IMPRESSION: Orogastric tube tip and side port near the gastroduodenal junction. Electronically Signed   By: Ulyses Jarred M.D.   On: 11/17/2016 14:48   Ct Head Wo Contrast  Result Date: 11/17/2016 CLINICAL DATA:  Altered level of consciousness. Found unresponsive at home by a neighbor. Intubated. EXAM: CT HEAD WITHOUT CONTRAST TECHNIQUE: Contiguous axial images were obtained from the base of the skull through the vertex without intravenous contrast. COMPARISON:  02/14/2008 FINDINGS: Brain: There is no evidence of acute infarct, intracranial hemorrhage, mass, midline shift, or extra-axial fluid collection. The ventricles are unchanged in size. The cerebral sulci and basilar cisterns were small on the prior study, however the sylvian fissures and basilar cisterns overall appear subtly effaced relative to prior. There is a crowded appearance of the foramen magnum without frank tonsillar herniation below the foramen magnum. No frank loss of gray-white differentiation is identified. Vascular: The minimal calcified atherosclerosis in the carotid siphons. No hyperdense vessel. Skull:  No fracture or focal osseous lesion. Sinuses/Orbits: Moderate mucosal thickening in the ethmoid sinuses with mild mucosal thickening in the frontal and sphenoid sinuses. Frothy material in the right sphenoid sinus. Soft tissue thickening/ retained secretions in the nasal cavity with a nasogastric tube partially visualized. Trace bilateral mastoid effusions. Right greater than left periorbital soft tissue swelling. Other: None. IMPRESSION: 1. No evidence of acute intracranial hemorrhage or large territory infarct. 2. Slight effacement of the extra-axial CSF spaces compared to the 2009 examination which may reflect increased intracranial pressure such as from early cerebral edema. 3. Right greater than left periorbital soft tissue swelling. Electronically Signed   By: Logan Bores M.D.   On: 11/17/2016 16:56   Dg Chest Port 1 View  Result Date: 11/17/2016 CLINICAL DATA:  Patient is unresponsive.  Patient on ventilator. EXAM: PORTABLE CHEST 1 VIEW COMPARISON:  Chest CT 03/07/2016 FINDINGS: ET tube terminates in the mid trachea. Enteric  tube courses inferior to the diaphragm. Pacer pads overlie the patient. Stable cardiac and mediastinal contours. Pulmonary hyperinflation. Re- demonstrated with slight consolidative opacity within the right mid lung. No pleural effusion or pneumothorax. IMPRESSION: ET tube mid trachea. Pulmonary hyperinflation. Electronically Signed   By: Lovey Newcomer M.D.   On: 11/17/2016 14:50     STUDIES:  11/17/16 CT head>>No evidence of acute intracranial hemorrhage or large territory infarct.Slight effacement of the extra-axial CSF spaces compared to the2009 examination which may reflect increased intracranial pressuresuch as from early cerebral edema.. Right greater than left periorbital soft tissue swelling.  CULTURES: 11/17/16 BC>> 11/17/16 UC>>  ANTIBIOTICS: None  SIGNIFICANT EVENTS: 11/17/16 Patient intubated and on mechanical ventilation admitted to ICU  LINES/TUBES: 11/17/16 ET  tube>>  ASSESSMENT / PLAN:  PULMONARY A: Acute On Chronic Respiratory Failure secondary to AECOPD Heavy Tobacco Abuse. P:   Vent settings established  VAP bundle implemented CXR in am Continue Bronchodilators Continue Steroids Routine ABG  CARDIOVASCULAR A:  Hypotension P:  Continuous Telemetry Keep MAP goal>60 Neosynephrine gtt to achieve the MAP goal  RENAL A:   No active issues P:   Monitor I/O F/u BMET intermittently Replace electrolytes per usual guidelines.  GASTROINTESTINAL A:   No active issues P:   Npo  Will start TF ON 8/8 Pepcid for GI prophylaxis  HEMATOLOGIC A:   No active issues P:  Lovenox for DVT prophylaxis Transfuse if Hgb<7  INFECTIOUS A:   Lactic acidosis P:   Follow cultures Follow lactic acid/PCT Monitor fever,CBC No need of antibiotics at this time  ENDOCRINE A:   No active issues P:   Check Blood glucose intermittently with BMP  NEUROLOGIC A:   Vent associated discomfort CT head indicative of early cerebral edema Right Ankle swelling P:   RASS goal: 0 to -1 PAD protocol implemented Routine Neuro checks Ordered X-RAY of right foot    FAMILY  - Updates: Husband was updated regarding the Plan of care.  Husband want only short term intervention for the patient and would want only for her to be intubated till her daughter gets here.  Husband repetitively said that he does not want his wife to receive any chest compressions in the event of cardiac arrest.  Patient is a DNR.     Jaidan Prevette,AG-ACNP Pulmonary and [place]   11/17/2016, 8:07 PM

## 2016-11-17 NOTE — Progress Notes (Signed)
[facility] Progress Note Patient Name: Patty Coleman DOB: Aug 01, 1960 MRN: [id number]   Date of Service  11/17/2016  HPI/Events of Note  Pt arrived to ICU from ED, found down, possible AECOPD. CT head>> possible early cerebral edema. Pt was  DNR, husband wanted short term intubation,  will remain DNR, [place] of [place]. Mild hypotension.  Received 4L IVF.   eICU Interventions  Will start phenylephrine via peripheral.       Intervention Category Major Interventions: Hypotension - evaluation and management  [name] 11/17/2016, 6:26 PM

## 2016-11-17 NOTE — H&P (Signed)
[facility] [place] NAME: Patty Coleman    MR#:  [id number]  DATE OF BIRTH:  May 27, 1960  DATE OF ADMISSION:  11/17/2016  PRIMARY CARE PHYSICIAN: Mikey College, NP   REQUESTING/REFERRING PHYSICIAN: Lisa Roca  CHIEF COMPLAINT: Unresponsiveness    Chief Complaint  Patient presents with  . Loss of Consciousness    HISTORY OF PRESENT ILLNESS:  Patty Coleman  is a 56 y.o. female with a known history ofSevere COPD, heavy tobacco abuse smokes about 1-1/2 pack to 2 packs a course of day, on 4-5 L of oxygen brought in by EMS. Found unresponsive by family members. And EMS arrived O2 sats of 50%. Intubated in the emergency room, she is on full vent support. Hypotensive at this time. Patient is a DO NOT RESUSCITATE but were available at the time of intubation. Husband is admitted for 1-2 days of intubation to see if she recovers patient was started on BuSpar for anxiety recently in May. PAST MEDICAL HISTORY:   Past Medical History:  Diagnosis Date  . Anxiety   . Cancer (HCC)    cervical stage I  . COPD (chronic obstructive pulmonary disease) ([place])   . Hypercholesteremia    previously on medications  . Urinary incontinence     PAST SURGICAL HISTOIRY:   Past Surgical History:  Procedure Laterality Date  . ABDOMINAL HYSTERECTOMY    . CESAREAN SECTION      SOCIAL HISTORY:   Social History  Substance Use Topics  . Smoking status: Current Every Day Smoker    Packs/day: 1.25    Years: 35.00    Types: Cigarettes  . Smokeless tobacco: Never Used     Comment: would like to quit  . Alcohol use No    FAMILY HISTORY:   Family History  Problem Relation Age of Onset  . COPD Mother   . Heart disease Father        heart attack and defibrillator  . Alzheimer's disease Father   . Irritable bowel syndrome Brother     DRUG ALLERGIES:   Allergies  Allergen Reactions  . Sulfa Antibiotics Swelling     angioedema  . Eggs Or Egg-Derived Products Hives and Nausea Only  . Lincocin [Lincomycin Hcl] Hives    REVIEW OF SYSTEMS:  Currently intubated and sedated.  MEDICATIONS AT HOME:   Prior to Admission medications   Medication Sig Start Date End Date Taking? Authorizing Provider  BREO ELLIPTA 100-25 MCG/INH AEPB Take 1 puff by mouth daily.   Yes [provider]  busPIRone (BUSPAR) 5 MG tablet Take 1 tablet (5 mg total) by mouth 3 (three) times daily. 08/14/16  Yes Mikey College, NP  COMBIVENT RESPIMAT 20-100 MCG/ACT AERS respimat Take 1 puff by mouth 4 (four) times daily.   Yes [provider]  ibuprofen (ADVIL,MOTRIN) 200 MG tablet Take 200 mg by mouth every 6 (six) hours as needed for fever or mild pain.   Yes [provider]  INCRUSE ELLIPTA 62.5 MCG/INH AEPB Take 1 puff by mouth daily.   Yes [provider]  ipratropium-albuterol (DUONEB) 0.5-2.5 (3) MG/3ML SOLN Take 3 mLs by nebulization every 4 (four) hours as needed (shortness of breath).   Yes [provider]  PROAIR HFA 108 (90 Base) MCG/ACT inhaler Take 2 puffs by mouth every 4 (four) hours as needed.   Yes [provider]  guaiFENesin (MUCINEX) 600 MG 12 hr tablet Take 1 tablet (600  mg total) by mouth 2 (two) times daily. Patient not taking: Reported on 11/17/2016 03/08/16   Dustin Flock, MD      VITAL SIGNS:  Blood pressure (!) 194/89, pulse 86, SpO2 100 %.  PHYSICAL EXAMINATION:  GENERAL:  56 y.o.-year-old patient lying in the bed. Trying to  Bite the tube. EYES: Pupils equal, round, reactive to light and accommodation. No scleral icterus.  HEENT: Head atraumatic, [email or web address]  NECK:  Supple, no jugular venous distention. No thyroid enlargement, no tenderness.  LUNGS: Bilateral expiratory wheeze in all lung fields. CARDIOVASCULAR: S1, S2 normal. No murmurs, rubs, or gallops.  ABDOMEN: Soft, nontender, nondistended. Bowel sounds present. No  organomegaly or mass.  EXTREMITIES: No pedal edema, cyanosis, or clubbing.  NEUROLOGIC: intubated /sedated/ PSYCHIATRIC: The patient is intubated /sedated/ SKIN: No obvious rash, lesion, or ulcer.   LABORATORY PANEL:   CBC  Recent Labs Lab 11/17/16 1418  WBC 9.1  HGB 11.4*  HCT 35.0  PLT 197   ------------------------------------------------------------------------------------------------------------------  Chemistries   Recent Labs Lab 11/17/16 1418  NA 133*  K 3.9  CL 80*  CO2 44*  GLUCOSE 157*  BUN 15  CREATININE 0.47  CALCIUM 9.0  AST 16  ALT 7*  ALKPHOS 54  BILITOT 0.7   ------------------------------------------------------------------------------------------------------------------  Cardiac Enzymes  Recent Labs Lab 11/17/16 1418  TROPONINI <0.03   ------------------------------------------------------------------------------------------------------------------  RADIOLOGY:  Dg Abdomen 1 View  Result Date: 11/17/2016 CLINICAL DATA:  Orogastric tube placement EXAM: ABDOMEN - 1 VIEW COMPARISON:  None. FINDINGS: The tip and side port of the orogastric tube are near the gastroduodenal junction, with its tip probably in the distal first portion of the duodenum. Lung bases are clear. Normal cardiomediastinal contours. IMPRESSION: Orogastric tube tip and side port near the gastroduodenal junction. Electronically Signed   By: Ulyses Jarred M.D.   On: 11/17/2016 14:48   Ct Head Wo Contrast  Result Date: 11/17/2016 CLINICAL DATA:  Altered level of consciousness. Found unresponsive at home by a neighbor. Intubated. EXAM: CT HEAD WITHOUT CONTRAST TECHNIQUE: Contiguous axial images were obtained from the base of the skull through the vertex without intravenous contrast. COMPARISON:  02/14/2008 FINDINGS: Brain: There is no evidence of acute infarct, intracranial hemorrhage, mass, midline shift, or extra-axial fluid collection. The ventricles are unchanged in size. The  cerebral sulci and basilar cisterns were small on the prior study, however the sylvian fissures and basilar cisterns overall appear subtly effaced relative to prior. There is a crowded appearance of the foramen magnum without frank tonsillar herniation below the foramen magnum. No frank loss of gray-white differentiation is identified. Vascular: The minimal calcified atherosclerosis in the carotid siphons. No hyperdense vessel. Skull: No fracture or focal osseous lesion. Sinuses/Orbits: Moderate mucosal thickening in the ethmoid sinuses with mild mucosal thickening in the frontal and sphenoid sinuses. Frothy material in the right sphenoid sinus. Soft tissue thickening/ retained secretions in the nasal cavity with a nasogastric tube partially visualized. Trace bilateral mastoid effusions. Right greater than left periorbital soft tissue swelling. Other: None. IMPRESSION: 1. No evidence of acute intracranial hemorrhage or large territory infarct. 2. Slight effacement of the extra-axial CSF spaces compared to the 2009 examination which may reflect increased intracranial pressure such as from early cerebral edema. 3. Right greater than left periorbital soft tissue swelling. Electronically Signed   By: Logan Bores M.D.   On: 11/17/2016 16:56   Dg Chest Port 1 View  Result Date: 11/17/2016 CLINICAL DATA:  Patient is unresponsive.  Patient on ventilator. EXAM: PORTABLE  CHEST 1 VIEW COMPARISON:  Chest CT 03/07/2016 FINDINGS: ET tube terminates in the mid trachea. Enteric tube courses inferior to the diaphragm. Pacer pads overlie the patient. Stable cardiac and mediastinal contours. Pulmonary hyperinflation. Re- demonstrated with slight consolidative opacity within the right mid lung. No pleural effusion or pneumothorax. IMPRESSION: ET tube mid trachea. Pulmonary hyperinflation. Electronically Signed   By: Lovey Newcomer M.D.   On: 11/17/2016 14:50    EKG:   Orders placed or performed during the hospital encounter of  03/04/16  . EKG    IMPRESSION AND PLAN:   1.Acute on chronic hypercapnic respiratory failure: Due to COPD exacerbation patient has severe wheezing when she came.she  has less wheezing.husband has been agreed for intubation and management temporarily. Continue full meant support, levophed drip, percedex drip.check the pro-calcitonin to see if she needs antibiotics, continue IV steroids, bronchodilators. Consult intensivist. 2. Unresponsiveness: Patient head CT showed slight early cerebral edema . According to husband patient has been having episodes of jerking and also lethargy on and off for the past few days. #2 severe COPD, on 4 L of oxygen at home, heavy tobacco abuse still smokes about one and half to 2 packs of cigars a day. #3 anxiety, depression: Patient has been to [facility] in May and was started on BuSpar. She was on Xanax before that's been weaned off. #4 right leg swelling: Evaluate for DVT.  All the records are reviewed and case discussed with ED provider. Management plans discussed with the patient, family and they are in agreement.  CODE STATUS: DO NOT RESUSCITATE  TOTAL TIME TAKING CARE OF THIS PATIENT: 55 critical care minutes.    Epifanio Lesches M.D on 11/17/2016 at 5:13 PM  Between 7am to 6pm - Pager - [phone]  After 6pm go to [email or web address] - password [email or web address] [facility]  [facility]  Office  [phone]  CC: Primary care physician; Mikey College, NP  Note: This dictation was prepared with Dragon dictation along with smaller phrase technology. Any transcriptional errors that result from this process are unintentional.

## 2016-11-17 NOTE — ED Notes (Signed)
Per Dr. Reita Cliche, titrate versed and fentanyl by written instructions on orders.  Spoke with Corene Cornea in pharmacy, unknown as to why orders will not allow for drugs to be charged as titratable without override.  MD aware and okay with overriding orders to titrate

## 2016-11-17 NOTE — Therapy (Signed)
Patient transported to and from CT via Trilogy vent without incident. Large amount of secretions. Back in room, vent in dedicated outlet.

## 2016-11-17 NOTE — ED Provider Notes (Signed)
[facility] Emergency Department Provider Note ____________________________________________   I have reviewed the triage vital signs and the triage nursing note.  HISTORY  Chief Complaint Loss of Consciousness   Historian Patient  history limited by unresponsive. Level 5 caveat History per EMS, mother-in-law found the patient, provided history to EMS  Later after patient in the ED and treated, husband came and provided additional history  HPI Patty Coleman is a 56 y.o. female brought in by EMS with complaint of unresponsive and found hypoxic in the 80s. Hospital like spell, patient apparently wears home oxygen. EMS got report from mother-in-law at the scene that found the patient was on the ground unresponsive.  No report of DO NOT RESUSCITATE.  When the patient's husband showed up later he was able to state the patient has had increasing trouble with her breathing over the past several days including the last 2 days she has had altered mental status with the trouble breathing including nodding off.  Patient brings out paperwork stating the patient has a DO NOT RESUSCITATE which is not signed.  There was no report of fever.    Past Medical History:  Diagnosis Date  . Anxiety   . Cancer (HCC)    cervical stage I  . COPD (chronic obstructive pulmonary disease) ([place])   . Hypercholesteremia    previously on medications  . Urinary incontinence     Patient Active Problem List   Diagnosis Date Noted  . Acute on chronic respiratory failure ([place]) 11/17/2016  . Palliative care encounter   . Goals of care, counseling/discussion   . DNR (do not resuscitate) discussion   . Tobacco use 11/02/2012  . Anxiety 02/25/2012  . Hypercholesterolemia 10/07/2011  . COPD (chronic obstructive pulmonary disease) ([place]) 08/28/2011  . Osteoarthritis 08/28/2011    Past Surgical History:  Procedure Laterality Date  . ABDOMINAL HYSTERECTOMY    . CESAREAN  SECTION      Prior to Admission medications   Medication Sig Start Date End Date Taking? Authorizing Provider  BREO ELLIPTA 100-25 MCG/INH AEPB Take 1 puff by mouth daily.   Yes [provider]  busPIRone (BUSPAR) 5 MG tablet Take 1 tablet (5 mg total) by mouth 3 (three) times daily. 08/14/16  Yes Mikey College, NP  COMBIVENT RESPIMAT 20-100 MCG/ACT AERS respimat Take 1 puff by mouth 4 (four) times daily.   Yes [provider]  ibuprofen (ADVIL,MOTRIN) 200 MG tablet Take 200 mg by mouth every 6 (six) hours as needed for fever or mild pain.   Yes [provider]  INCRUSE ELLIPTA 62.5 MCG/INH AEPB Take 1 puff by mouth daily.   Yes [provider]  ipratropium-albuterol (DUONEB) 0.5-2.5 (3) MG/3ML SOLN Take 3 mLs by nebulization every 4 (four) hours as needed (shortness of breath).   Yes [provider]  PROAIR HFA 108 (90 Base) MCG/ACT inhaler Take 2 puffs by mouth every 4 (four) hours as needed.   Yes [provider]  guaiFENesin (MUCINEX) 600 MG 12 hr tablet Take 1 tablet (600 mg total) by mouth 2 (two) times daily. Patient not taking: Reported on 11/17/2016 03/08/16   Dustin Flock, MD    Allergies  Allergen Reactions  . Sulfa Antibiotics Swelling    angioedema  . Eggs Or Egg-Derived Products Hives and Nausea Only  . Lincocin [Lincomycin Hcl] Hives    Family History  Problem Relation Age of Onset  . COPD Mother   . Heart disease Father  heart attack and defibrillator  . Alzheimer's disease Father   . Irritable bowel syndrome Brother     Social History Social History  Substance Use Topics  . Smoking status: Current Every Day Smoker    Packs/day: 1.25    Years: 35.00    Types: Cigarettes  . Smokeless tobacco: Never Used     Comment: would like to quit  . Alcohol use No    Review of Systems  history limited by altered mental status. Level 5 caveat ____________________________________________   PHYSICAL  EXAM:  VITAL SIGNS: ED Triage Vitals [11/17/16 1413]  Enc Vitals Group     BP (!) 194/89     Pulse Rate 86     Resp      Temp      Temp src      SpO2 100 %     Weight      Height      Head Circumference      Peak Flow      Pain Score      Pain Loc      Pain Edu?      Excl. in [place]?      Constitutional: Unresponsive. Wearing nonrebreather. HEENT   Head: Normocephalic and atraumatic.      Eyes: Conjunctivae are normal. Pupils equal and round , minimal responsiveness, 4-3 mm..       Ears:         Nose: No congestion/rhinnorhea.   Mouth/Throat: Mucous membranes are mildly dry.   Neck: No stridor. Cardiovascular/Chest: Normal rate, regular rhythm.  No murmurs, rubs, or gallops. Respiratory: Tachypnea, no retractions. Decreased air movement throughout, tight breath sounds without obvious wheezing or rhonchi. Gastrointestinal: Soft. No distention Genitourinary/rectal:Deferred Musculoskeletal: Normal appearance of the extremities. Neurologic:  Unresponsive to voice or sternal rub.  Skin:  Skin is warm, dry and intact. No rash noted.    ____________________________________________  LABS (pertinent positives/negatives)  Labs Reviewed  ACETAMINOPHEN LEVEL - Abnormal; Notable for the following:       Result Value   Acetaminophen (Tylenol), Serum <10 (*)    All other components within normal limits  COMPREHENSIVE METABOLIC PANEL - Abnormal; Notable for the following:    Sodium 133 (*)    Chloride 80 (*)    CO2 44 (*)    Glucose, Bld 157 (*)    ALT 7 (*)    All other components within normal limits  CBC WITH DIFFERENTIAL/PLATELET - Abnormal; Notable for the following:    Hemoglobin 11.4 (*)    Neutro Abs 7.9 (*)    Lymphs Abs 0.7 (*)    All other components within normal limits  URINALYSIS, COMPLETE (UACMP) WITH MICROSCOPIC - Abnormal; Notable for the following:    Color, Urine YELLOW (*)    APPearance HAZY (*)    Glucose, UA 50 (*)    Hgb urine dipstick SMALL  (*)    Ketones, ur 20 (*)    Protein, ur 100 (*)    All other components within normal limits  CULTURE, BLOOD (ROUTINE X 2)  CULTURE, BLOOD (ROUTINE X 2)  URINE CULTURE  ETHANOL  LIPASE, BLOOD  TROPONIN I  LACTIC ACID, PLASMA  PROTIME-INR  LACTIC ACID, PLASMA  BLOOD GAS, ARTERIAL  HIV ANTIBODY (ROUTINE TESTING)  BASIC METABOLIC PANEL  CBC  CBC  CREATININE, SERUM  BRAIN NATRIURETIC PEPTIDE  PROCALCITONIN  PROCALCITONIN    ____________________________________________    EKG I, Lisa Roca, MD, the attending physician have personally viewed and interpreted  all ECGs.  89 bpm normal sinus rhythm. Narrow QRS. Normal axis. Biatrial enlargement. Nonspecific ST and T-wave ____________________________________________  RADIOLOGY All Xrays were viewed by me. Imaging interpreted by Radiologist.  Chest x-ray portable :  IMPRESSION: ET tube mid trachea.  Pulmonary hyperinflation.  Abdomen 1 view:  IMPRESSION: Orogastric tube tip and side port near the gastroduodenal junction.  CT head:  IMPRESSION: 1. No evidence of acute intracranial hemorrhage or large territory infarct. 2. Slight effacement of the extra-axial CSF spaces compared to the 2009 examination which may reflect increased intracranial pressure such as from early cerebral edema. 3. Right greater than left periorbital soft tissue swelling. __________________________________________  PROCEDURES  Procedure(s) performed: INTUBATION Performed by: Lisa Roca  Required items: required blood products, implants, devices, and special equipment available Patient identity confirmed: provided demographic data and hospital-assigned identification number Time out: Immediately prior to procedure a "time out" was called to verify the correct patient, procedure, equipment, support staff and site/side marked as required.  Indications: respiratory failure, not protecting airway  Intubation method: Glidescope Laryngoscopy    Preoxygenation: BVM  Sedatives: Etomidate Paralytic: Succinylcholine  Tube Size: 7.5 cuffed  Post-procedure assessment: chest rise and ETCO2 monitor Breath sounds: equal and absent over the epigastrium Tube secured with: ETT holder Chest x-ray interpreted by radiologist and me.  Chest x-ray findings: endotracheal tube in appropriate position  Patient tolerated the procedure well with no immediate complications.     Critical Care performed: CRITICAL CARE Performed by: Lisa Roca   Total critical care time: 70 minutes  Critical care time was exclusive of separately billable procedures and treating other patients.  Critical care was necessary to treat or prevent imminent or life-threatening deterioration.  Critical care was time spent personally by me on the following activities: development of treatment plan with patient and/or surrogate as well as nursing, discussions with consultants, evaluation of patient's response to treatment, examination of patient, obtaining history from patient or surrogate, ordering and performing treatments and interventions, ordering and review of laboratory studies, ordering and review of radiographic studies, pulse oximetry and re-evaluation of patient's condition.   ____________________________________________   ED COURSE / ASSESSMENT AND PLAN  Pertinent labs & imaging results that were available during my care of the patient were reviewed by me and considered in my medical decision making (see chart for details).   Patient arrived with unwitnessed downtime, in terms of unresponsive, and was hypoxic at the scene, still not responding and not protecting airway. EMS did not report any DO NOT RESUSCITATE. No family available.  Patient was intubated to protect her airway.  Husband did calm and state the patient had been having increased respiratory problems for at least 2 days with some altered mental status including falling asleep. It  sounds to me that clinically she was in a hypercarbic state. She has severe COPD, she is on home oxygen.  Patient's husband does state that the patient had indicated wishes as a DO NOT RESUSCITATE. He produced some paperwork which she has not signed.  I spoke with him at length about the option of discontinuing care, but potentially treating what appears to be a COPD exacerbation with respiratory failure, unclear whether or not the hypoxia and altered mental status with past several days had created any hypoxic brain injury.  CT head shows possible early cerebral edema. Patient clinically has been doing better since she is here, more responsive in terms of a bite into moving her head. She is not following commands. Sedation was somewhat  difficult initially given her low blood pressures. Patient was given IV fluids 2 L, with resultant increase in blood pressure. However patient still having low blood pressures in the 80s and 90s and she is going to get 2 more liters of fluid. Does not appear to be having other signs of sepsis at this point.  Husband has called his children and they are coming in from [place]. Family is in agreement that they would like to continue to treat her although she would be a DO NOT RESUSCITATE if her heart were to stop at this point. I think this is quite reasonable. Family can reconver been after seeing how she does overnight, in terms of discontinuing care.    CONSULTATIONS:   Hospitalist, Dr. Vianne Bulls for admission.  Patient / Family / Caregiver informed of clinical course, medical decision-making process, and agree with plan.  ___________________________________________   FINAL CLINICAL IMPRESSION(S) / ED DIAGNOSES   Final diagnoses:  Acute respiratory failure, unspecified whether with hypoxia or hypercapnia (HCC)  Altered mental status, unspecified altered mental status type              Note: This dictation was prepared with Dragon dictation. Any  transcriptional errors that result from this process are unintentional    Lisa Roca, MD 11/17/16 1724

## 2016-11-17 NOTE — Therapy (Signed)
Patient transported from E.D. To CCU 4 via Trilogy vent without incident. Report given to [facility] RRT. Patient placed on Servo I vent at ordered settings.

## 2016-11-17 NOTE — Progress Notes (Signed)
Lovenox order changed to 40mg  daily due to crcl >30 ml/min and BMI <40  Fleurette Woolbright D Saanvi Hakala, Pharm.D, BCPS Clinical Pharmacist

## 2016-11-17 NOTE — Progress Notes (Signed)
Already seen by E link physician.

## 2016-11-17 NOTE — ED Triage Notes (Signed)
Per ems pt found unresponsive at home by neighbor. Pt to ed on non-rebreather. No family in er.

## 2016-11-18 DIAGNOSIS — J9600 Acute respiratory failure, unspecified whether with hypoxia or hypercapnia: Secondary | ICD-10-CM

## 2016-11-18 DIAGNOSIS — Z7189 Other specified counseling: Secondary | ICD-10-CM

## 2016-11-18 LAB — BASIC METABOLIC PANEL
Anion gap: 9 (ref 5–15)
BUN: 18 mg/dL (ref 6–20)
CHLORIDE: 95 mmol/L — AB (ref 101–111)
CO2: 32 mmol/L (ref 22–32)
CREATININE: 0.73 mg/dL (ref 0.44–1.00)
Calcium: 8.1 mg/dL — ABNORMAL LOW (ref 8.9–10.3)
Glucose, Bld: 155 mg/dL — ABNORMAL HIGH (ref 65–99)
POTASSIUM: 3.6 mmol/L (ref 3.5–5.1)
SODIUM: 136 mmol/L (ref 135–145)

## 2016-11-18 LAB — CBC
HEMATOCRIT: 29.3 % — AB (ref 35.0–47.0)
HEMOGLOBIN: 9.5 g/dL — AB (ref 12.0–16.0)
MCH: 28.7 pg (ref 26.0–34.0)
MCHC: 32.5 g/dL (ref 32.0–36.0)
MCV: 88.3 fL (ref 80.0–100.0)
Platelets: 142 10*3/uL — ABNORMAL LOW (ref 150–440)
RBC: 3.32 MIL/uL — AB (ref 3.80–5.20)
RDW: 13.5 % (ref 11.5–14.5)
WBC: 6.8 10*3/uL (ref 3.6–11.0)

## 2016-11-18 LAB — TRIGLYCERIDES: Triglycerides: 151 mg/dL — ABNORMAL HIGH (ref <150)

## 2016-11-18 LAB — GLUCOSE, CAPILLARY: Glucose-Capillary: 160 mg/dL — ABNORMAL HIGH (ref 65–99)

## 2016-11-18 LAB — PROCALCITONIN: Procalcitonin: <0.1 ng/mL

## 2016-11-18 MED ORDER — PROPOFOL 1000 MG/100ML IV EMUL
5.0000 ug/kg/min | INTRAVENOUS | Status: DC
  Administered 2016-11-18: 5 ug/kg/min via INTRAVENOUS
  Administered 2016-11-19: 20 ug/kg/min via INTRAVENOUS
  Administered 2016-11-19: 30 ug/kg/min via INTRAVENOUS
  Filled 2016-11-18 (×3): qty 100

## 2016-11-18 NOTE — Progress Notes (Signed)
Pt lying in bed awakes to tactile stimuli, pt cont. On fentanyl 27mcg. Pt will intermittently follow commands.further assessment via flow sheet.

## 2016-11-18 NOTE — Progress Notes (Signed)
I have spoken to Husband and after further assessment, daughters will be arriving tomorrow and will plan for weaning trials  Patient was DNR/DNI prior to admission. Will plan for increased sedation and re-assess SAT/SBT in AM  Family are satisfied with Plan of action and management. All questions answered  Corrin Parker, M.D.  [facility]  Medical Director [facility] Director [facility] Cardio-Pulmonary Department

## 2016-11-18 NOTE — Progress Notes (Signed)
Initial Nutrition Assessment  DOCUMENTATION CODES:   Not applicable  INTERVENTION:  If patient expected to remain intubated longer than 24-48 hours, recommend initiating Vital 1.5 at 40 ml/hr (total goal volume of 960 ml daily) + Pro-Stat 30 ml BID via NGT. Goal regimen provides 1640 kcal (96% estimated kcal needs), 95 grams of protein, 730 ml H2O daily.  If tube feeds are initiated, recommend liquid multivitamin with minerals per tube daily as goal TF regimen does not meet 100% RDIs.  Will monitor outcome of discussions regarding goals of care.  NUTRITION DIAGNOSIS:   Inadequate oral intake related to inability to eat as evidenced by NPO status.  GOAL:   Provide needs based on ASPEN/SCCM guidelines  MONITOR:   Vent status, Labs, Weight trends, TF tolerance, Skin, I & O's  REASON FOR ASSESSMENT:   Ventilator    ASSESSMENT:   56 year old female with PMHx of COPD, stage I cervical cancer s/p abdominal hysterectomy, hypercholesteremia, anxiety who was found unresponsive by family members and emergently intubated on 8/7. Patient with acute respiratory failure from acute encephalopathy from probable anoxic brain injury in setting of end-stage COPD. CT head 8/7 showing increased intracranial pressure possibly from early cerebral edema.   -Per chart husband wants short-term intubation to see if she recovers, but would not want any chest compressions. Patient is DNR. -Pending consult to PMT in setting of acute on chronic respiratory failure/cerebral edema.  Noted allergy to eggs or egg-derived products. Per review of weights in chart patient was 161 lbs on 03/04/2016 and 148.2 lbs on 08/14/2016.  Access: NGT; per abdominal x-ray on 8/7 tip and side port of NGT are near gastroduodenal junction with tip probably in distal first portion of duodenum  Patient is currently intubated on ventilator support MV: 10.6 L/min Temp (24hrs), Avg:99.3 F (37.4 C), Min:98.4 F (36.9 C), Max:100 F  (37.8 C)  Propofol: N/A  Medications reviewed and include: Colace, Medline mouth rinse, methylprednisolone 60 mg Q6hrs, NS @ 75 ml/hr, famotidine, fentanyl gtt, Versed gtt (off since shortly after midnight), phenylephrine gtt (off since shortly after midnight).  Labs reviewed: CBG 96-160, Chloride 95.   Nutrition-Focused physical exam completed. Findings are no fat depletion, no muscle depletion, and no edema. Right foot no longer mottled on assessment by RD.  I/O: 1395 ml UOP yesterday, 100 ml output from NGT  Discussed with RN. Also discussed in rounds today. Plan may be for medical one-way extubation today.  Diet Order:  Diet NPO time specified  Skin:  Wound (see comment) (skin tear right elbow; right foot mottled)  Last BM:  Unknown  Height:   Ht Readings from Last 1 Encounters:  11/17/16 5\' 6"  (1.676 m)    Weight:   Wt Readings from Last 1 Encounters:  11/18/16 159 lb 2.8 oz (72.2 kg)    Ideal Body Weight:  59.1 kg  BMI:  Body mass index is 25.69 kg/m.  Estimated Nutritional Needs:   Kcal:  1708 (PSU 2003b w/ MSJ 1332, Ve 10.6, Tmax 37.8)  Protein:  87-100 grams (1.2-1.4 grams/kg)  Fluid:  2.1-2.5 L/day (30-35 ml/kg)  EDUCATION NEEDS:   No education needs identified at this time  Willey Blade, MS, RD, LDN Pager: [phone] After Hours Pager: [phone]

## 2016-11-18 NOTE — Progress Notes (Signed)
[facility] NAME: Shaneil Yazdi    MR#:  [id number]  DATE OF BIRTH:  [birth year]  SUBJECTIVE:  CHIEF COMPLAINT:   Chief Complaint  Patient presents with  . Loss of Consciousness  The patient is a 56 year old Caucasian female with past medical history significant for history of severe COPD, heavy tobacco abuse, who was found to be unresponsive by family members, EMS was called, her O2 sats was in 29s, she was brought to emergency room and intubated, admitted to intensive care unit. She is alert, admits of some cough and sputum production prior to coming to the hospital. Patient was seen by pulmonologist, recommended to continue ventilatory support until tomorrow and possibly remove ET tube tomorrow.  Review of Systems  Unable to perform ROS: Intubated  Respiratory: Positive for cough and sputum production.     VITAL SIGNS: Blood pressure 140/71, pulse 85, temperature 99.3 F (37.4 C), temperature source Axillary, resp. rate (!) 24, height 5\' 6"  (1.676 m), weight 72.2 kg (159 lb 2.8 oz), SpO2 96 %.  PHYSICAL EXAMINATION:   GENERAL:  56 y.o.-year-old patient lying in the bed with no acute distress.  EYES: Pupils equal, round, reactive to light and accommodation. No scleral icterus. Extraocular muscles intact.  HEENT: Head atraumatic, normocephalic. Oropharynx and nasopharynx clear.  NECK:  Supple, no jugular venous distention. No thyroid enlargement, no tenderness.  LUNGS: Normal breath sounds bilaterally, no wheezing, rales,rhonchi or crepitation. No use of accessory muscles of respiration.  CARDIOVASCULAR: S1, S2 normal. No murmurs, rubs, or gallops.  ABDOMEN: Soft, nontender, nondistended. Bowel sounds present. No organomegaly or mass.  EXTREMITIES: No pedal edema, cyanosis, or clubbing.  NEUROLOGIC: Cranial nerves II through XII are intact. Muscle strength 5/5 in all extremities. Sensation intact. Gait not  checked.  PSYCHIATRIC: The patient is alert and oriented x 3. Follows commands, however tires easily SKIN: No obvious rash, lesion, or ulcer.   ORDERS/RESULTS REVIEWED:   CBC  Recent Labs Lab 11/17/16 1418 11/18/16 0534  WBC 9.1 6.8  HGB 11.4* 9.5*  HCT 35.0 29.3*  PLT 197 142*  MCV 88.6 88.3  MCH 28.9 28.7  MCHC 32.6 32.5  RDW 13.9 13.5  LYMPHSABS 0.7*  --   MONOABS 0.3  --   EOSABS 0.1  --   BASOSABS 0.0  --    ------------------------------------------------------------------------------------------------------------------  Chemistries   Recent Labs Lab 11/17/16 1418 11/18/16 0534  NA 133* 136  K 3.9 3.6  CL 80* 95*  CO2 44* 32  GLUCOSE 157* 155*  BUN 15 18  CREATININE 0.47 0.73  CALCIUM 9.0 8.1*  AST 16  --   ALT 7*  --   ALKPHOS 54  --   BILITOT 0.7  --    ------------------------------------------------------------------------------------------------------------------ estimated creatinine clearance is 80 mL/min (by C-G formula based on SCr of 0.73 mg/dL). ------------------------------------------------------------------------------------------------------------------ No results for input(s): TSH, T4TOTAL, T3FREE, THYROIDAB in the last 72 hours.  Invalid input(s): FREET3  Cardiac Enzymes  Recent Labs Lab 11/17/16 1418  TROPONINI <0.03   ------------------------------------------------------------------------------------------------------------------ Invalid input(s): POCBNP ---------------------------------------------------------------------------------------------------------------  RADIOLOGY: Dg Abdomen 1 View  Result Date: 11/17/2016 CLINICAL DATA:  Orogastric tube placement EXAM: ABDOMEN - 1 VIEW COMPARISON:  None. FINDINGS: The tip and side port of the orogastric tube are near the gastroduodenal junction, with its tip probably in the distal first portion of the duodenum. Lung bases are clear. Normal cardiomediastinal contours. IMPRESSION:  Orogastric tube tip and side port near the  gastroduodenal junction. Electronically Signed   By: Ulyses Jarred M.D.   On: 11/17/2016 14:48   Ct Head Wo Contrast  Result Date: 11/17/2016 CLINICAL DATA:  Altered level of consciousness. Found unresponsive at home by a neighbor. Intubated. EXAM: CT HEAD WITHOUT CONTRAST TECHNIQUE: Contiguous axial images were obtained from the base of the skull through the vertex without intravenous contrast. COMPARISON:  02/14/2008 FINDINGS: Brain: There is no evidence of acute infarct, intracranial hemorrhage, mass, midline shift, or extra-axial fluid collection. The ventricles are unchanged in size. The cerebral sulci and basilar cisterns were small on the prior study, however the sylvian fissures and basilar cisterns overall appear subtly effaced relative to prior. There is a crowded appearance of the foramen magnum without frank tonsillar herniation below the foramen magnum. No frank loss of gray-white differentiation is identified. Vascular: The minimal calcified atherosclerosis in the carotid siphons. No hyperdense vessel. Skull: No fracture or focal osseous lesion. Sinuses/Orbits: Moderate mucosal thickening in the ethmoid sinuses with mild mucosal thickening in the frontal and sphenoid sinuses. Frothy material in the right sphenoid sinus. Soft tissue thickening/ retained secretions in the nasal cavity with a nasogastric tube partially visualized. Trace bilateral mastoid effusions. Right greater than left periorbital soft tissue swelling. Other: None. IMPRESSION: 1. No evidence of acute intracranial hemorrhage or large territory infarct. 2. Slight effacement of the extra-axial CSF spaces compared to the 2009 examination which may reflect increased intracranial pressure such as from early cerebral edema. 3. Right greater than left periorbital soft tissue swelling. Electronically Signed   By: Logan Bores M.D.   On: 11/17/2016 16:56   Dg Chest Port 1 View  Result Date:  11/17/2016 CLINICAL DATA:  Patient is unresponsive.  Patient on ventilator. EXAM: PORTABLE CHEST 1 VIEW COMPARISON:  Chest CT 03/07/2016 FINDINGS: ET tube terminates in the mid trachea. Enteric tube courses inferior to the diaphragm. Pacer pads overlie the patient. Stable cardiac and mediastinal contours. Pulmonary hyperinflation. Re- demonstrated with slight consolidative opacity within the right mid lung. No pleural effusion or pneumothorax. IMPRESSION: ET tube mid trachea. Pulmonary hyperinflation. Electronically Signed   By: Lovey Newcomer M.D.   On: 11/17/2016 14:50   Dg Ankle Right Port  Result Date: 11/17/2016 CLINICAL DATA:  Right ankle edema. EXAM: PORTABLE RIGHT ANKLE - 2 VIEW COMPARISON:  None. FINDINGS: There is no evidence of fracture, dislocation, or joint effusion. Diffuse soft tissue swelling present. No arthropathy or evidence of bone lesion. IMPRESSION: Diffuse soft tissue swelling without evidence of underlying bony abnormalities. Electronically Signed   By: Aletta Edouard M.D.   On: 11/17/2016 21:58    EKG:  Orders placed or performed during the hospital encounter of 11/17/16  . EKG 12-Lead  . EKG 12-Lead    ASSESSMENT AND PLAN:  Active Problems:   Acute respiratory failure ([place])   Palliative care by specialist #1. Acute on chronic respiratory failure with hypoxia and hypercapnia, status post intubation and mechanical ventilation, discussed with Dr. Mortimer Fries, pulmonologist, who is talking to family members regards to continuation of therapy, possible extubation tomorrow when family is available, palliative care consultation is appreciated. Patient is to continue Solu Medrol, due nebs.  #2. Metabolic encephalopathy, due to CO2 narcosis, resolved  #3. Hyperglycemia, get hemoglobin, A1c, likely stress, steroid related #4. Hyponatremia, resolved #5  thrombocytopenia, follow closely Management plans discussed with the patient, family and they are in agreement.   DRUG ALLERGIES:    Allergies  Allergen Reactions  . Sulfa Antibiotics Swelling    angioedema  .  Eggs Or Egg-Derived Products Hives and Nausea Only  . Lincocin [Lincomycin Hcl] Hives    CODE STATUS:     Code Status Orders        Start     Ordered   11/17/16 1707  Do not attempt resuscitation (DNR)  Continuous    Question Answer Comment  In the event of cardiac or respiratory ARREST Do not call a "code blue"   In the event of cardiac or respiratory ARREST Do not perform Intubation, CPR, defibrillation or ACLS   In the event of cardiac or respiratory ARREST Use medication by any route, position, wound care, and other measures to relive pain and suffering. May use oxygen, suction and manual treatment of airway obstruction as needed for comfort.      11/17/16 1710    Code Status History    Date Active Date Inactive Code Status Order ID Comments User Context   08/14/2016  5:01 PM 11/17/2016  2:07 PM DNR [id number] Confirmed DNR.  Patient and husband verbalize understanding to withhold intubation, CPR, defibrillation, and ACLS protocol. Mikey College, NP Outpatient   03/06/2016  1:59 PM 03/08/2016  2:31 PM DNR [id number]  Basilio Cairo, NP Inpatient   03/05/2016  1:36 AM 03/06/2016  1:59 PM Full Code [id number]  Hugelmeyer, Ubaldo Glassing, DO Inpatient    Advance Directive Documentation     Most Recent Value  Type of Advance Directive  Out of facility DNR (pink MOST or yellow form)  Pre-existing out of facility DNR order (yellow form or pink MOST form)  Yellow form placed in chart (order not valid for inpatient use)  "MOST" Form in Place?  -      TOTAL TIME TAKING CARE OF THIS PATIENT: 40 minutes.    Theodoro Grist M.D on 11/18/2016 at 12:36 PM  Between 7am to 6pm - Pager - [phone]  After 6pm go to [email or web address] - password [email or web address] [facility]  Office  [phone]  CC: Primary care physician; Allyne Gee, MD

## 2016-11-18 NOTE — Consult Note (Signed)
Consultation Note Date: 11/18/2016   Patient Name: Patty Coleman  DOB: December 31, 1960  MRN: [id number]  Age / Sex: 56 y.o., female  PCP: Allyne Gee, MD Referring Physician: Theodoro Grist, MD  Reason for Consultation: Establishing goals of care  HPI/Patient Profile: 56 y.o. female  with past medical history of COPD on home oxygen 4-5L, smoker, anxiety, urinary incontinence, hypercholesteremia, cervical cancer s/p hysterectomy admitted on 11/17/2016 with unresponsiveness from home. EMS found oxygen saturation in 50's. In ED, patient was intubated and sedated. Also with hypotension and given 4L [place]. Chest xray reveals pulmonary hyperinflation. Lactic acid 2.3 Procalcitonin <0.10. Currently on ventilator 35% FIO2 and fentanyl gtt. Palliative medicine consultation for goals of care.    Clinical Assessment and Goals of Care: I have reviewed medical records, discussed with care team, and met with husband Juanda Crumble) to discuss diagnosis, [place], EOL wishes, disposition and options. This morning, the patient is intubated but awake, following commands, and nods head yes/no to questions.   Introduced Palliative Medicine as specialized medical care for people living with serious illness. It focuses on providing relief from the symptoms and stress of a serious illness. The goal is to improve quality of life for both the patient and the family.  We discussed a brief life review of the patient. Prior to hospitalization, living home with husband. Twin daughters living in [place]. Patient with severe COPD and on home oxygen 4-5 L. Juanda Crumble tells me she sits around all day. "She hasn't left the house in two years." She becomes very short of breath with any exertion. He assists her with all ADL's. Recently, she has been having short episodes of lethargy.    I attempted to elicit values and goals of care important to the patient.  Juanda Crumble speaks of the importance of "not taking decisions away from her" even if these decision have led to disease progression and EOL. She has continued to smoke and often refuses to "get up and walk around." He speaks of the challenges of being her primary caregiver along with his own medical conditions (recent knee replacement) but also is adamant to keep her out of a nursing facility.   Discussed hospital diagnoses, interventions, and underlying COPD. Patient has a documented living will and HCPOA Juanda Crumble). She has spoken clearly of her wishes for DNR/DNI and no heroic measures if terminal. Reviewed in EPIC.   Juanda Crumble is "ashamed" of himself for not being with here yesterday when she went unresponsive. "She should have never been intubated." He feels she is upset with him. "Since it's already said and done" he is now waiting for twin daughters to arrive from [place]. But now the thought of withdrawing care makes him "feel like I'm murdering my wife." Emotional support provided. He is waiting for daughters to help with decisions moving forward.   The patient has palliative services following at home but Juanda Crumble tells me she was refusing appointments. Juanda Crumble asks about hospice services. Educated on hospice options but explained that we will further evaluate depending on clinical  condition after extubation.   Questions and concerns were addressed. Cr[place] declines chaplain consult. Therapeutic listening and emotional support provided.     SUMMARY OF RECOMMENDATIONS    Husband confirms DNR/DNI. No re-intubation when extubated.   Waiting for daughters to arrive. Continue current interventions.   Educated on hospice options depending on status after extubation.   PMT will continue to support patient/family through hospitalization.   Code Status/Advance Care Planning:  DNR  Symptom Management:   Per attending  Palliative Prophylaxis:   Aspiration, Delirium Protocol, Frequent Pain  Assessment, Oral Care and Turn Reposition  Psycho-social/Spiritual:   Desire for further Chaplaincy support: yes  Additional Recommendations: Caregiving  Support/Resources and Education on Hospice  Prognosis:   Unable to determine: guarded with end-stage COPD now intubated.  Discharge Planning: To Be Determined      Primary Diagnoses: Present on Admission: . Acute on chronic respiratory failure ([place])   I have reviewed the medical record, interviewed the patient and family, and examined the patient. The following aspects are pertinent.  Past Medical History:  Diagnosis Date  . Anxiety   . Cancer (HCC)    cervical stage I  . COPD (chronic obstructive pulmonary disease) ([place])   . Hypercholesteremia    previously on medications  . Urinary incontinence    Social History   Social History  . Marital status: Married    Spouse name: N/A  . Number of children: N/A  . Years of education: N/A   Social History Main Topics  . Smoking status: Current Every Day Smoker    Packs/day: 1.25    Years: 35.00    Types: Cigarettes  . Smokeless tobacco: Never Used     Comment: would like to quit  . Alcohol use No  . Drug use: No  . Sexual activity: Not Asked   Other Topics Concern  . None   Social History Narrative  . None   Family History  Problem Relation Age of Onset  . COPD Mother   . Heart disease Father        heart attack and defibrillator  . Alzheimer's disease Father   . Irritable bowel syndrome Brother    Scheduled Meds: . chlorhexidine gluconate (MEDLINE KIT)  15 mL Mouth Rinse BID  . docusate sodium  100 mg Oral BID  . enoxaparin (LOVENOX) injection  40 mg Subcutaneous Q24H  . ipratropium-albuterol  3 mL Nebulization Q4H  . mouth rinse  15 mL Mouth Rinse 10 times per day  . methylPREDNISolone (SOLU-MEDROL) injection  60 mg Intravenous Q6H   Continuous Infusions: . sodium chloride 75 mL/hr at 11/18/16 1000  . famotidine (PEPCID) IV Stopped (11/18/16 1019)   . fentaNYL 50 mcg/hr (11/18/16 1000)  . midazolam (VERSED) infusion Stopped (11/18/16 0010)  . phenylephrine (NEO-SYNEPHRINE) Adult infusion Stopped (11/18/16 0135)  . propofol (DIPRIVAN) infusion     PRN Meds:.acetaminophen **OR** acetaminophen, bisacodyl, HYDROcodone-acetaminophen, ondansetron **OR** ondansetron (ZOFRAN) IV, traZODone Medications Prior to Admission:  Prior to Admission medications   Medication Sig Start Date End Date Taking? Authorizing Provider  BREO ELLIPTA 100-25 MCG/INH AEPB Take 1 puff by mouth daily.   Yes [provider]  busPIRone (BUSPAR) 5 MG tablet Take 1 tablet (5 mg total) by mouth 3 (three) times daily. 08/14/16  Yes Mikey College, NP  COMBIVENT RESPIMAT 20-100 MCG/ACT AERS respimat Take 1 puff by mouth 4 (four) times daily.   Yes [provider]  ibuprofen (ADVIL,MOTRIN) 200 MG tablet Take 200 mg by mouth  every 6 (six) hours as needed for fever or mild pain.   Yes [provider]  INCRUSE ELLIPTA 62.5 MCG/INH AEPB Take 1 puff by mouth daily.   Yes [provider]  ipratropium-albuterol (DUONEB) 0.5-2.5 (3) MG/3ML SOLN Take 3 mLs by nebulization every 4 (four) hours as needed (shortness of breath).   Yes [provider]  PROAIR HFA 108 (90 Base) MCG/ACT inhaler Take 2 puffs by mouth every 4 (four) hours as needed.   Yes [provider]  guaiFENesin (MUCINEX) 600 MG 12 hr tablet Take 1 tablet (600 mg total) by mouth 2 (two) times daily. Patient not taking: Reported on 11/17/2016 03/08/16   Dustin Flock, MD   Allergies  Allergen Reactions  . Sulfa Antibiotics Swelling    angioedema  . Eggs Or Egg-Derived Products Hives and Nausea Only  . Lincocin [Lincomycin Hcl] Hives   Review of Systems  Unable to perform ROS: Acuity of condition   Physical Exam  Constitutional: She appears ill. She is intubated.  HENT:  Head: Normocephalic and atraumatic.  Cardiovascular: Regular rhythm.     Pulmonary/Chest: No accessory muscle usage. No tachypnea. She is intubated. No respiratory distress. She has decreased breath sounds. She has wheezes.  Vent 35%  Neurological: She is alert.  Follows commands  Skin: Skin is warm and dry. There is pallor.  Nursing note and vitals reviewed.  Vital Signs: BP 140/71   Pulse 85   Temp 99.4 F (37.4 C) (Axillary)   Resp (!) 24   Ht _0  (1.676 m)   Wt 72.2 kg (159 lb 2.8 oz)   SpO2 96%   BMI 25.69 kg/m  Pain Assessment: CPOT     SpO2: SpO2: 96 % O2 Device:SpO2: 96 % O2 Flow Rate: .   IO: Intake/output summary:   Intake/Output Summary (Last 24 hours) at 11/18/16 1158 Last data filed at 11/18/16 1000  Gross per 24 hour  Intake          1856.96 ml  Output             1495 ml  Net           361.96 ml   LBM: Last BM Date:  (unknown) Baseline Weight: Weight: 72.2 kg (159 lb 2.8 oz) Most recent weight: Weight: 72.2 kg (159 lb 2.8 oz)     Palliative Assessment/Data: PPS 20%   Flowsheet Rows     Most Recent Value  Intake Tab  Referral Department  Critical care  Unit at Time of Referral  ICU  Palliative Care Primary Diagnosis  Pulmonary  Palliative Care Type  Return patient Palliative Care  Date first seen by Palliative Care  11/18/16  Clinical Assessment  Palliative Performance Scale Score  20%  Psychosocial & Spiritual Assessment  Palliative Care Outcomes  Patient/Family meeting held?  Yes  Who was at the meeting?  husband  Palliative Care Outcomes  Clarified goals of care, Provided end of life care assistance, Provided psychosocial or spiritual support, ACP counseling assistance, Counseled regarding hospice     Time In: 1000 Time Out: 1115 Time Total: 19mn Greater than 50%  of this time was spent counseling and coordinating care related to the above assessment and plan.  Signed by:  MIhor Dow FNP-C Palliative Medicine Team  Phone: [phone]: [phone]  Please contact Palliative Medicine Team  phone at [phone] questions and concerns.  For individual provider: See AShea Evans

## 2016-11-19 DIAGNOSIS — J9601 Acute respiratory failure with hypoxia: Secondary | ICD-10-CM

## 2016-11-19 DIAGNOSIS — Z515 Encounter for palliative care: Secondary | ICD-10-CM

## 2016-11-19 DIAGNOSIS — R0600 Dyspnea, unspecified: Secondary | ICD-10-CM

## 2016-11-19 LAB — BLOOD CULTURE ID PANEL (REFLEXED)
Acinetobacter baumannii: NOT DETECTED
CANDIDA ALBICANS: NOT DETECTED
CANDIDA GLABRATA: NOT DETECTED
CANDIDA TROPICALIS: NOT DETECTED
Candida krusei: NOT DETECTED
Candida parapsilosis: NOT DETECTED
ENTEROBACTER CLOACAE COMPLEX: NOT DETECTED
ENTEROBACTERIACEAE SPECIES: NOT DETECTED
Enterococcus species: NOT DETECTED
Escherichia coli: NOT DETECTED
Haemophilus influenzae: NOT DETECTED
KLEBSIELLA PNEUMONIAE: NOT DETECTED
Klebsiella oxytoca: NOT DETECTED
LISTERIA MONOCYTOGENES: NOT DETECTED
Methicillin resistance: DETECTED — AB
Neisseria meningitidis: NOT DETECTED
Proteus species: NOT DETECTED
Pseudomonas aeruginosa: NOT DETECTED
STREPTOCOCCUS AGALACTIAE: NOT DETECTED
STREPTOCOCCUS PNEUMONIAE: NOT DETECTED
STREPTOCOCCUS PYOGENES: NOT DETECTED
STREPTOCOCCUS SPECIES: NOT DETECTED
Serratia marcescens: NOT DETECTED
Staphylococcus aureus (BCID): NOT DETECTED
Staphylococcus species: DETECTED — AB

## 2016-11-19 LAB — BLOOD GAS, ARTERIAL
Acid-Base Excess: 12.5 mmol/L — ABNORMAL HIGH (ref 0.0–2.0)
BICARBONATE: 38.2 mmol/L — AB (ref 20.0–28.0)
FIO2: 0.35
O2 Saturation: 90.1 %
PCO2 ART: 55 mmHg — AB (ref 32.0–48.0)
PEEP: 5 cmH2O
PH ART: 7.45 (ref 7.350–7.450)
PO2 ART: 56 mmHg — AB (ref 83.0–108.0)
PRESSURE SUPPORT: 5 cmH2O
Patient temperature: 37

## 2016-11-19 LAB — PROCALCITONIN

## 2016-11-19 LAB — HIV ANTIBODY (ROUTINE TESTING W REFLEX): HIV Screen 4th Generation wRfx: NONREACTIVE

## 2016-11-19 LAB — URINE CULTURE: Culture: NO GROWTH

## 2016-11-19 LAB — GLUCOSE, CAPILLARY: GLUCOSE-CAPILLARY: 133 mg/dL — AB (ref 65–99)

## 2016-11-19 MED ORDER — MORPHINE SULFATE (CONCENTRATE) 10 MG/0.5ML PO SOLN
5.0000 mg | ORAL | Status: DC | PRN
  Administered 2016-11-19: 5 mg via SUBLINGUAL
  Filled 2016-11-19: qty 1

## 2016-11-19 MED ORDER — LORAZEPAM 2 MG/ML IJ SOLN: 0.5000 mg | INTRAMUSCULAR | Status: DC | PRN

## 2016-11-19 NOTE — Progress Notes (Signed)
[facility] NAME: Shamiyah Ngu    MR#:  [id number]  DATE OF BIRTH:  [birth year]  SUBJECTIVE:  CHIEF COMPLAINT:   Chief Complaint  Patient presents with  . Loss of Consciousness  The patient is a 56 year old Caucasian female with past medical history significant for history of severe COPD, heavy tobacco abuse, who was found to be unresponsive by family members, EMS was called, her O2 sats was in 67s, she was brought to emergency room and intubated, admitted to intensive care unit.  Intensivist/pulmonologist recommended to continue ventilatory support , discussed with patient's family and removed the T-tube today. Patient was extubated, placed on 6 L of oxygen, tolerates it well, with oxygen saturations is 93%, according to medical records. The patient was seen to for extubation. Anxiety attack prior to extubation, requiring some medications to calm patient down  Review of Systems  Unable to perform ROS: Intubated  Respiratory: Positive for cough and sputum production.     VITAL SIGNS: Blood pressure (!) 159/67, pulse (!) 114, temperature (!) 97.1 F (36.2 C), temperature source Oral, resp. rate (!) 24, height 5\' 6"  (1.676 m), weight 72 kg (158 lb 11.7 oz), SpO2 96 %.  PHYSICAL EXAMINATION:   GENERAL:  56 y.o.-year-old patient lying in the bed with no acute distress, intubated, sedated, sleeping  EYES: Pupils equal, round, reactive to light and accommodation. No scleral icterus. Extraocular muscles intact.  HEENT: Head atraumatic, normocephalic. Oropharynx and nasopharynx clear.  NECK:  Supple, no jugular venous distention. No thyroid enlargement, no tenderness.  LUNGS: Some diminished breath sounds bilaterally, no wheezing, rales,rhonchi or crepitation. No use of accessory muscles of respiration.  CARDIOVASCULAR: S1, S2 normal. No murmurs, rubs, or gallops.  ABDOMEN: Soft, nontender, nondistended. Bowel sounds  present. No organomegaly or mass.  EXTREMITIES: No pedal edema, cyanosis, or clubbing.  NEUROLOGIC: Cranial nerves II through XII are intact. Muscle strength 5/5 in all extremities. Sensation intact. Gait not checked.  PSYCHIATRIC: The patient is sedated, sleepy SKIN: No obvious rash, lesion, or ulcer.   ORDERS/RESULTS REVIEWED:   CBC  Recent Labs Lab 11/17/16 1418 11/18/16 0534  WBC 9.1 6.8  HGB 11.4* 9.5*  HCT 35.0 29.3*  PLT 197 142*  MCV 88.6 88.3  MCH 28.9 28.7  MCHC 32.6 32.5  RDW 13.9 13.5  LYMPHSABS 0.7*  --   MONOABS 0.3  --   EOSABS 0.1  --   BASOSABS 0.0  --    ------------------------------------------------------------------------------------------------------------------  Chemistries   Recent Labs Lab 11/17/16 1418 11/18/16 0534  NA 133* 136  K 3.9 3.6  CL 80* 95*  CO2 44* 32  GLUCOSE 157* 155*  BUN 15 18  CREATININE 0.47 0.73  CALCIUM 9.0 8.1*  AST 16  --   ALT 7*  --   ALKPHOS 54  --   BILITOT 0.7  --    ------------------------------------------------------------------------------------------------------------------ estimated creatinine clearance is 79.8 mL/min (by C-G formula based on SCr of 0.73 mg/dL). ------------------------------------------------------------------------------------------------------------------ No results for input(s): TSH, T4TOTAL, T3FREE, THYROIDAB in the last 72 hours.  Invalid input(s): FREET3  Cardiac Enzymes  Recent Labs Lab 11/17/16 1418  TROPONINI <0.03   ------------------------------------------------------------------------------------------------------------------ Invalid input(s): POCBNP ---------------------------------------------------------------------------------------------------------------  RADIOLOGY: Dg Abdomen 1 View  Result Date: 11/17/2016 CLINICAL DATA:  Orogastric tube placement EXAM: ABDOMEN - 1 VIEW COMPARISON:  None. FINDINGS: The tip and side port of the orogastric tube are near  the gastroduodenal junction, with its tip probably in the  distal first portion of the duodenum. Lung bases are clear. Normal cardiomediastinal contours. IMPRESSION: Orogastric tube tip and side port near the gastroduodenal junction. Electronically Signed   By: Ulyses Jarred M.D.   On: 11/17/2016 14:48   Ct Head Wo Contrast  Result Date: 11/17/2016 CLINICAL DATA:  Altered level of consciousness. Found unresponsive at home by a neighbor. Intubated. EXAM: CT HEAD WITHOUT CONTRAST TECHNIQUE: Contiguous axial images were obtained from the base of the skull through the vertex without intravenous contrast. COMPARISON:  02/14/2008 FINDINGS: Brain: There is no evidence of acute infarct, intracranial hemorrhage, mass, midline shift, or extra-axial fluid collection. The ventricles are unchanged in size. The cerebral sulci and basilar cisterns were small on the prior study, however the sylvian fissures and basilar cisterns overall appear subtly effaced relative to prior. There is a crowded appearance of the foramen magnum without frank tonsillar herniation below the foramen magnum. No frank loss of gray-white differentiation is identified. Vascular: The minimal calcified atherosclerosis in the carotid siphons. No hyperdense vessel. Skull: No fracture or focal osseous lesion. Sinuses/Orbits: Moderate mucosal thickening in the ethmoid sinuses with mild mucosal thickening in the frontal and sphenoid sinuses. Frothy material in the right sphenoid sinus. Soft tissue thickening/ retained secretions in the nasal cavity with a nasogastric tube partially visualized. Trace bilateral mastoid effusions. Right greater than left periorbital soft tissue swelling. Other: None. IMPRESSION: 1. No evidence of acute intracranial hemorrhage or large territory infarct. 2. Slight effacement of the extra-axial CSF spaces compared to the 2009 examination which may reflect increased intracranial pressure such as from early cerebral edema. 3. Right  greater than left periorbital soft tissue swelling. Electronically Signed   By: Logan Bores M.D.   On: 11/17/2016 16:56   Dg Chest Port 1 View  Result Date: 11/17/2016 CLINICAL DATA:  Patient is unresponsive.  Patient on ventilator. EXAM: PORTABLE CHEST 1 VIEW COMPARISON:  Chest CT 03/07/2016 FINDINGS: ET tube terminates in the mid trachea. Enteric tube courses inferior to the diaphragm. Pacer pads overlie the patient. Stable cardiac and mediastinal contours. Pulmonary hyperinflation. Re- demonstrated with slight consolidative opacity within the right mid lung. No pleural effusion or pneumothorax. IMPRESSION: ET tube mid trachea. Pulmonary hyperinflation. Electronically Signed   By: Lovey Newcomer M.D.   On: 11/17/2016 14:50   Dg Ankle Right Port  Result Date: 11/17/2016 CLINICAL DATA:  Right ankle edema. EXAM: PORTABLE RIGHT ANKLE - 2 VIEW COMPARISON:  None. FINDINGS: There is no evidence of fracture, dislocation, or joint effusion. Diffuse soft tissue swelling present. No arthropathy or evidence of bone lesion. IMPRESSION: Diffuse soft tissue swelling without evidence of underlying bony abnormalities. Electronically Signed   By: Aletta Edouard M.D.   On: 11/17/2016 21:58    EKG:  Orders placed or performed during the hospital encounter of 11/17/16  . EKG 12-Lead  . EKG 12-Lead    ASSESSMENT AND PLAN:  Active Problems:   Acute respiratory failure ([place])   Palliative care by specialist #1. Acute on chronic respiratory failure with hypoxia and hypercapnia, status post intubation and mechanical ventilation, Now extubated, discussed with Dr. Mortimer Fries, pulmonologist. Patient is to continue Solu Medrol, due nebs. Patient does not want to be reintubated. #2. Metabolic encephalopathy, due to CO2 narcosis, resolved , follow after extubation #3. Hyperglycemia, hemoglobin, A1c is pending, likely stress, steroid related #4. Hyponatremia, resolved #5  thrombocytopenia, follow closely Management plans  discussed with the patient, family and they are in agreement.   DRUG ALLERGIES:  Allergies  Allergen  Reactions  . Sulfa Antibiotics Swelling    angioedema  . Eggs Or Egg-Derived Products Hives and Nausea Only  . Lincocin [Lincomycin Hcl] Hives    CODE STATUS:     Code Status Orders        Start     Ordered   11/17/16 1707  Do not attempt resuscitation (DNR)  Continuous    Question Answer Comment  In the event of cardiac or respiratory ARREST Do not call a "code blue"   In the event of cardiac or respiratory ARREST Do not perform Intubation, CPR, defibrillation or ACLS   In the event of cardiac or respiratory ARREST Use medication by any route, position, wound care, and other measures to relive pain and suffering. May use oxygen, suction and manual treatment of airway obstruction as needed for comfort.      11/17/16 1710    Code Status History    Date Active Date Inactive Code Status Order ID Comments User Context   08/14/2016  5:01 PM 11/17/2016  2:07 PM DNR [id number] Confirmed DNR.  Patient and husband verbalize understanding to withhold intubation, CPR, defibrillation, and ACLS protocol. Mikey College, NP Outpatient   03/06/2016  1:59 PM 03/08/2016  2:31 PM DNR [id number]  Basilio Cairo, NP Inpatient   03/05/2016  1:36 AM 03/06/2016  1:59 PM Full Code [id number]  Hugelmeyer, Ubaldo Glassing, DO Inpatient    Advance Directive Documentation     Most Recent Value  Type of Advance Directive  Out of facility DNR (pink MOST or yellow form)  Pre-existing out of facility DNR order (yellow form or pink MOST form)  Yellow form placed in chart (order not valid for inpatient use)  "MOST" Form in Place?  -      TOTAL TIME TAKING CARE OF THIS PATIENT: 35 minutes.    Theodoro Grist M.D on 11/19/2016 at 12:25 PM  Between 7am to 6pm - Pager - [phone]  After 6pm go to [email or web address] - password [email or web address] [facility]  Office  [phone]  CC: Primary care physician;  Allyne Gee, MD

## 2016-11-19 NOTE — Progress Notes (Signed)
Pt extubated at 81.  ETT and NG tube discontinued per order by Drf Kasa.   Ephriam Knuckles, RN

## 2016-11-19 NOTE — Progress Notes (Signed)
Daily Progress Note   Patient Name: Patty Coleman       Date: 11/19/2016 DOB: [birth year]  Age: 56 y.o. MRN#: [id number] Attending Physician: Theodoro Grist, MD Primary Care Physician: Allyne Gee, MD Admit Date: 11/17/2016  Reason for Consultation/Follow-up: Establishing goals of care  Subjective/GOC: Sat in on family meeting with Dr. Mortimer Fries, husband, and two daughters this AM prior to extubation. Patient extubated and tolerating 5L [place].   Follow-up this afternoon. No family at bedside. Patient is awake, alert, and oriented. Complains of anxiety and back pain. Dyspnea at rest. Patient states "I'm done. I'm tired." She speaks of the daily challenges of living with severe COPD. She cannot sit in bed without getting short of breath and oxygen saturations drop significantly with ambulation. She is extremely anxious and has chronic back pain.   [name] asks about options moving forward. We discussed physical therapy/nursing home versus hospice services. She does not want to work with physical therapy and again tells me she is tired. Discussed hospice options--home versus hospice home. Educated on eligibility for hospice home if she should decline this hospitalization. We discussed hospice services coming into her home, which may be an option (need to discuss with family). Educated on hospice philosophy with focus on comfort, quality, and dignity at the end-of-life. Educated on medications as needed for symptom management. Patient agreeable with hospice services.   Answered questions and concerns. Therapeutic listening and emotional support provided.   Length of Stay: 2  Current Medications: Scheduled Meds:  . chlorhexidine gluconate (MEDLINE KIT)  15 mL Mouth Rinse BID  . docusate sodium  100 mg  Oral BID  . enoxaparin (LOVENOX) injection  40 mg Subcutaneous Q24H  . ipratropium-albuterol  3 mL Nebulization Q4H  . mouth rinse  15 mL Mouth Rinse 10 times per day  . methylPREDNISolone (SOLU-MEDROL) injection  60 mg Intravenous Q6H    Continuous Infusions: . sodium chloride 75 mL/hr at 11/19/16 1200  . famotidine (PEPCID) IV Stopped (11/19/16 1022)  . fentaNYL Stopped (11/19/16 1017)  . midazolam (VERSED) infusion Stopped (11/18/16 0010)  . phenylephrine (NEO-SYNEPHRINE) Adult infusion Stopped (11/18/16 0135)  . propofol (DIPRIVAN) infusion Stopped (11/19/16 1017)    PRN Meds: acetaminophen **OR** acetaminophen, bisacodyl, HYDROcodone-acetaminophen, LORazepam, morphine CONCENTRATE, ondansetron **OR** ondansetron (ZOFRAN) IV, traZODone  Physical Exam  Constitutional: She  is oriented to person, place, and time. She is cooperative. She appears ill.  HENT:  Head: Normocephalic and atraumatic.  Cardiovascular: Regular rhythm.   Pulmonary/Chest: No accessory muscle usage. No tachypnea. No respiratory distress. She has decreased breath sounds.  Dyspnea at rest  Abdominal: Normal appearance.  Neurological: She is alert and oriented to person, place, and time.  Skin: Skin is warm and dry.  Psychiatric: Her speech is normal and behavior is normal. Cognition and memory are normal.  Nursing note and vitals reviewed.          Vital Signs: BP (!) 168/73 (BP Location: Left Arm)   Pulse 94   Temp 98 F (36.7 C) (Oral)   Resp 15   Ht '5\' 6"'$  (1.676 m)   Wt 72 kg (158 lb 11.7 oz)   SpO2 97%   BMI 25.62 kg/m  SpO2: SpO2: 97 % O2 Device: O2 Device: Nasal Cannula O2 Flow Rate: O2 Flow Rate (L/min): 5 L/min  Intake/output summary:   Intake/Output Summary (Last 24 hours) at 11/19/16 1414 Last data filed at 11/19/16 1200  Gross per 24 hour  Intake          1903.88 ml  Output             2170 ml  Net          -266.12 ml   LBM: Last BM Date:  (unknown) Baseline Weight: Weight: 72.2 kg  (159 lb 2.8 oz) Most recent weight: Weight: 72 kg (158 lb 11.7 oz)  Palliative Assessment/Data: PPS 20%   Flowsheet Rows     Most Recent Value  Intake Tab  Referral Department  Critical care  Unit at Time of Referral  ICU  Palliative Care Primary Diagnosis  Pulmonary  Palliative Care Type  Return patient Palliative Care  Date first seen by Palliative Care  11/18/16  Clinical Assessment  Palliative Performance Scale Score  20%  Psychosocial & Spiritual Assessment  Palliative Care Outcomes  Patient/Family meeting held?  Yes  Who was at the meeting?  patient, husband, and daughters  Palliative Care Outcomes  Clarified goals of care, Provided end of life care assistance, Provided psychosocial or spiritual support, ACP counseling assistance, Improved pain interventions, Improved non-pain symptom therapy      Patient Active Problem List   Diagnosis Date Noted  . Palliative care by specialist   . Acute respiratory failure ([place]) 11/17/2016  . Palliative care encounter   . Goals of care, counseling/discussion   . DNR (do not resuscitate) discussion   . Tobacco use 11/02/2012  . Anxiety 02/25/2012  . Hypercholesterolemia 10/07/2011  . COPD exacerbation ([place]) 08/28/2011  . Osteoarthritis 08/28/2011    Palliative Care Assessment & Plan   Patient Profile: 56 y.o. female  with past medical history of COPD on home oxygen 4-5L, smoker, anxiety, urinary incontinence, hypercholesteremia, cervical cancer s/p hysterectomy admitted on 11/17/2016 with unresponsiveness from home. EMS found oxygen saturation in 50's. In ED, patient was intubated and sedated. Also with hypotension and given 4L [place]. Chest xray reveals pulmonary hyperinflation. Lactic acid 2.3 Procalcitonin <0.10. Extubated 8/9 and now on 5L [place]. Palliative medicine consultation for goals of care.   Assessment: Acute on chronic respiratory failure COPD exacerbation Metabolic encephalopathy  resolved Hyperglycemia Hyponatremia Thrombocytopenia  Recommendations/Plan:  Patient confirms DNR/DNI. No re-intubation if she should decompensate.   Patient wishes to focus on comfort. Interested in hospice services at home on discharge. Will further discuss with family tomorrow.   Speech eval post  extubation. Patient requesting to eat.   Anxiety, back pain, dyspnea at rest  Roxanol 29m SL q3h prn pain/dyspnea/air hunger.   Ativan 0.558mIV q4h prn anxiety  PMT will f/u in AM to further discuss goals/hospice with patient/family.   Code Status: DNR/DNI   Code Status Orders        Start     Ordered   11/17/16 1707  Do not attempt resuscitation (DNR)  Continuous    Question Answer Comment  In the event of cardiac or respiratory ARREST Do not call a "code blue"   In the event of cardiac or respiratory ARREST Do not perform Intubation, CPR, defibrillation or ACLS   In the event of cardiac or respiratory ARREST Use medication by any route, position, wound care, and other measures to relive pain and suffering. May use oxygen, suction and manual treatment of airway obstruction as needed for comfort.      11/17/16 1710    Code Status History    Date Active Date Inactive Code Status Order ID Comments User Context   08/14/2016  5:01 PM 11/17/2016  2:07 PM DNR [id number] DNR.  Patient and husband verbalize understanding to withhold intubation, CPR, defibrillation, and ACLS protocol. KeMikey CollegeNP Outpatient   03/06/2016  1:59 PM 03/08/2016  2:31 PM DNR [id number] CairoNP Inpatient   03/05/2016  1:36 AM 03/06/2016  1:59 PM Full Code [id number], AlUbaldo GlassingDO Inpatient    Advance Directive Documentation     Most Recent Value  Type of Advance Directive  Out of facility DNR (pink MOST or yellow form)  Pre-existing out of facility DNR order (yellow form or pink MOST form)  Yellow form placed in chart (order not valid for inpatient use)  "MOST" Form  in Place?  -       Prognosis:   Unable to determine: guarded with acute on chronic respiratory failure, severe COPD, and risk for decompensation requiring intubation. Patient does NOT want to be re-intubated. Wishes to focus on comfort.   Discharge Planning:  To Be Determined  Care plan was discussed with patient, family, Dr. KaMortimer FriesRN  Thank you for allowing the Palliative Medicine Team to assist in the care of this patient.   Time In: 1000 1330 Time Out: 1025 1405 Total Time 6082mProlonged Time Billed  no      Greater than 50%  of this time was spent counseling and coordinating care related to the above assessment and plan.  MegIhor DowNP-C Palliative Medicine Team  Phone: [phone]: [phone] contact Palliative Medicine Team phone at [phone] questions and concerns.

## 2016-11-19 NOTE — Progress Notes (Signed)
Patient extubated at 11:15 and placed on [place] 6lpm .  Patient tolerating well HR 110  RR 18 oxygen saturations 93%.

## 2016-11-19 NOTE — Plan of Care (Signed)
Problem: Pain Managment: Goal: General experience of comfort will improve Outcome: Progressing Pt given [place] for pain and discomfort.  Pt stated condition improved on reassessment.

## 2016-11-19 NOTE — Progress Notes (Signed)
PHARMACY - PHYSICIAN COMMUNICATION CRITICAL VALUE ALERT - BLOOD CULTURE IDENTIFICATION (BCID)  No results found for this or any previous visit.  1/4 Staph spp. mec A(+)   Name of physician (or Provider) Contacted: Dana  Changes to prescribed antibiotics required: n/a  Natlie Asfour S 11/19/2016  7:22 AM

## 2016-11-19 NOTE — Progress Notes (Signed)
Dr Mortimer Fries at bedside.  Noted wakefussness of patient.  Discussed wake-up assessment.  Increased propofol to 69mcg/kg/min.   Marguarite Arbour, RN

## 2016-11-19 NOTE — Progress Notes (Signed)
After further discussion with patient and family, patient has successful SAT/SBT family at bedside, patient was alert and awake and family witnessed that PATIENT DOES NOT WANT TO BE RE-INTUBATED.  Family satisfied with plan of action.  Patient is DNR/DNI will NOT re-intubate patient   Patient/Family are satisfied with Plan of action and management. All questions answered  Corrin Parker, M.D.  [facility]  Medical Director [place] Director [facility] Cardio-Pulmonary Department

## 2016-11-19 NOTE — Consult Note (Signed)
PULMONARY / CRITICAL CARE MEDICINE   Name: Patty Coleman MRN: [id number] DOB: Jun 01, 1960    ADMISSION DATE:  11/17/2016  CONSULTATION DATE: 11/17/16  REFERRING MD: Dr. Sissy Hoff  CHIEF COMPLAINT: Loss of Conciousness  HISTORY OF PRESENT ILLNESS:   Patty Coleman is a 56 yo female with known history of severe COPD uses 4-5 liters of O2, Tobacco abuse and Hypercholesteremia.  Patient was found unresponsive by family members and EMS was called.  When EMS arrived on the scene her O2 sats were down to 50%.  Patient was brought to the ED where she was intubated .  Husband brought her DNR paperwork later and wishes her to be intubated for short period of time.  However in the case of Cardiac arrest he would not want any chest compression. Patient is placed in the ICU for further management.    REVIEW OF SYSTEMS:   Unable to obtain  SUBJECTIVE:  Unable to Obtain Remains intubated,sedated On full vent support Awaiting for family to arrive  VITAL SIGNS: BP 110/68   Pulse 65   Temp (!) 97.1 F (36.2 C) (Oral)   Resp (!) 22   Ht 5\' 6"  (1.676 m)   Wt 158 lb 11.7 oz (72 kg)   SpO2 100%   BMI 25.62 kg/m   HEMODYNAMICS:    VENTILATOR SETTINGS: Vent Mode: PRVC FiO2 (%):  [35 %-40 %] 35 % Set Rate:  [22 bmp] 22 bmp Vt Set:  [500 mL] 500 mL PEEP:  [5 cmH20] 5 cmH20 Plateau Pressure:  [33 cmH20] 33 cmH20  INTAKE / OUTPUT: I/O last 3 completed shifts: In: 3435.7 [I.V.:3335.7; IV Piggyback:100] Out: 2260 [Urine:1610; Emesis/NG output:650]  PHYSICAL EXAMINATION: General:Caucasian female, intubated and on mechanical ventilation Neuro: obtunded HEENT: AT,[place], No JVD, Pupils 2 and sluggish Cardiovascular: s1s2,regular, no m/r/g Lungs:  Faint expiratory wheezes, no rhonchi,crackles Abdomen:  Obese,round,NT Musculoskeletal:No edema,cyanosis Skin: warm, dry and intact  LABS:  BMET  Recent Labs Lab 11/17/16 1418 11/18/16 0534  NA 133* 136  K 3.9 3.6  CL 80*  95*  CO2 44* 32  BUN 15 18  CREATININE 0.47 0.73  GLUCOSE 157* 155*    Electrolytes  Recent Labs Lab 11/17/16 1418 11/18/16 0534  CALCIUM 9.0 8.1*    CBC  Recent Labs Lab 11/17/16 1418 11/18/16 0534  WBC 9.1 6.8  HGB 11.4* 9.5*  HCT 35.0 29.3*  PLT 197 142*    Coag's  Recent Labs Lab 11/17/16 1418  INR 0.90    Sepsis Markers  Recent Labs Lab 11/17/16 1418 11/17/16 1720 11/17/16 2201 11/18/16 0534  LATICACIDVEN 0.8 2.3*  --   --   PROCALCITON  --   --  <0.10 <0.10    ABG  Recent Labs Lab 11/17/16 1708  PHART 7.27*  PCO2ART 89*  PO2ART 95    Liver Enzymes  Recent Labs Lab 11/17/16 1418  AST 16  ALT 7*  ALKPHOS 54  BILITOT 0.7  ALBUMIN 4.3    Cardiac Enzymes  Recent Labs Lab 11/17/16 1418  TROPONINI <0.03    Glucose  Recent Labs Lab 11/17/16 1759 11/17/16 2152 11/18/16 0707 11/19/16 0724  GLUCAP 96 107* 160* 133*    Imaging No results found.   STUDIES:  11/17/16 CT head>>No evidence of acute intracranial hemorrhage or large territory infarct.Slight effacement of the extra-axial CSF spaces compared to the2009 examination which may reflect increased intracranial pressuresuch as from early cerebral edema.. Right greater than left periorbital soft tissue swelling.  CULTURES: 11/17/16  BC>> 11/17/16 UC>>  ANTIBIOTICS: None  SIGNIFICANT EVENTS: 11/17/16 Patient intubated and on mechanical ventilation admitted to ICU  LINES/TUBES: 11/17/16 ET tube>>  ASSESSMENT / PLAN:   56 yo white female with end stage COPD with severe  acute COPD exacerbation now on vent support Plan for weaning assessment when family arrives  PULMONARY A: Acute On Chronic Respiratory Failure secondary to AECOPD Heavy Tobacco Abuse. P:   Vent settings established  VAP bundle implemented CXR in am Continue Bronchodilators Continue Steroids  CARDIOVASCULAR A:  Hypotension P:  Continuous Telemetry Keep MAP goal>60 Neosynephrine gtt to  achieve the MAP goal  RENAL A:   No active issues P:   Monitor I/O F/u BMET intermittently Replace electrolytes per usual guidelines.  GASTROINTESTINAL A:   No active issues P:   Npo  Pepcid for GI prophylaxis  HEMATOLOGIC A:   No active issues P:  Lovenox for DVT prophylaxis Transfuse if Hgb<7  INFECTIOUS A:   Lactic acidosis P:   Follow cultures Follow lactic acid/PCT Monitor fever,CBC No need of antibiotics at this time  ENDOCRINE A:   No active issues P:   Check Blood glucose intermittently with BMP  NEUROLOGIC A:   Vent associated discomfort CT head indicative of early cerebral edema Right Ankle swelling P:   RASS goal: 0 to -1 PAD protocol implemented Routine Neuro checks     Critical Care Time devoted to patient care services described in this note is 35 minutes.   Overall, patient is critically ill, prognosis is guarded.  Patient with Multiorgan failure and at high risk for cardiac arrest and death.    Corrin Parker, M.D.  [facility]  Medical Director [place] [facility] Cardio-Pulmonary Department

## 2016-11-20 DIAGNOSIS — J441 Chronic obstructive pulmonary disease with (acute) exacerbation: Secondary | ICD-10-CM

## 2016-11-20 DIAGNOSIS — R0602 Shortness of breath: Secondary | ICD-10-CM

## 2016-11-20 LAB — CULTURE, BLOOD (ROUTINE X 2): SPECIAL REQUESTS: ADEQUATE

## 2016-11-20 LAB — GLUCOSE, CAPILLARY: Glucose-Capillary: 102 mg/dL — ABNORMAL HIGH (ref 65–99)

## 2016-11-20 MED ORDER — ENSURE ENLIVE PO LIQD
237.0000 mL | Freq: Two times a day (BID) | ORAL | Status: DC
  Administered 2016-11-20 – 2016-11-23 (×6): 237 mL via ORAL

## 2016-11-20 MED ORDER — ALPRAZOLAM 0.5 MG PO TABS
0.5000 mg | ORAL_TABLET | Freq: Two times a day (BID) | ORAL | Status: DC
  Administered 2016-11-20 – 2016-11-23 (×7): 0.5 mg via ORAL
  Filled 2016-11-20 (×7): qty 1

## 2016-11-20 MED ORDER — NICOTINE 14 MG/24HR TD PT24
14.0000 mg | MEDICATED_PATCH | Freq: Every day | TRANSDERMAL | Status: DC
  Administered 2016-11-20 – 2016-11-23 (×4): 14 mg via TRANSDERMAL
  Filled 2016-11-20 (×4): qty 1

## 2016-11-20 MED ORDER — IPRATROPIUM-ALBUTEROL 0.5-2.5 (3) MG/3ML IN SOLN
3.0000 mL | Freq: Four times a day (QID) | RESPIRATORY_TRACT | Status: DC
  Administered 2016-11-20 – 2016-11-23 (×12): 3 mL via RESPIRATORY_TRACT
  Filled 2016-11-20 (×13): qty 3

## 2016-11-20 MED ORDER — MORPHINE SULFATE (CONCENTRATE) 10 MG/0.5ML PO SOLN
10.0000 mg | ORAL | Status: DC | PRN
  Administered 2016-11-20 (×3): 10 mg via SUBLINGUAL
  Filled 2016-11-20 (×3): qty 1

## 2016-11-20 MED ORDER — ORAL CARE MOUTH RINSE
15.0000 mL | Freq: Two times a day (BID) | OROMUCOSAL | Status: DC
  Administered 2016-11-20 – 2016-11-23 (×5): 15 mL via OROMUCOSAL

## 2016-11-20 NOTE — Care Management Note (Signed)
Case Management Note  Patient Details  Name: Patty Coleman MRN: [id number] Date of Birth: 04-18-1960  Subjective/Objective:    Admitted to [facility] with the diagnosis of respiratory distress to ICU. Placed on a ventilator.  Transferred to 1C 11/20/16. Lives with husband, Juanda Crumble, [phone]). [name] cell  Number is [phone]). Dr. Humphrey Rolls is listed as primary care physician. Prescriptions are filled at [facility] in [place] oxygen per [place] since 2014. 3-5 liters per nasal cannula. Bedside commode and nebulizer in the home. Would like another shower chair                 Action/Plan: Referral for Hospice services in the home. Discussed agencies with [name] at the bedside. Chose [facility] [place]. Doreatha Lew RN representative of [employer] laboratory technician updated   Expected Discharge Date:                  Expected Discharge Plan:     In-House Referral:   yes  Discharge planning Services     Post Acute Care Choice:   yes Choice offered to:   Ms Ernesto Rutherford   DME Arranged:    DME Agency:     HH Arranged:   yes [place] Agency:   [facility]   Status of Service:   in progress  If discussed at [employer] of Stay Meetings, dates discussed:    Additional Comments:  Shelbie Ammons, [facility] [phone] 11/20/2016, 1:42 PM

## 2016-11-20 NOTE — Progress Notes (Signed)
Daily Progress Note   Patient Name: Patty Coleman       Date: 11/20/2016 DOB: 1961/01/19  Age: 56 y.o. MRN#: [id number] Attending Physician: Nicholes Mango, MD Primary Care Physician: Allyne Gee, MD Admit Date: 11/17/2016  Reason for Consultation/Follow-up: Establishing goals of care  Subjective/GOC: Patient awake, alert, oriented. Eating lunch this afternoon. C/o of anxiety. Also dyspnea at rest.   Husband and two daughters at bedside. Discussed course of hospitalization and intervention. Discussed disease trajectory of COPD and concern for continued exacerbation and decompensation. Patient adamantly tells [place] she does not want to be re-intubated or resuscitated. She has spoken clearly of her wishes to her family. Husband feels she is still upset that she was intubated on admission.   Discussed options moving forward. Patient not interested in physical therapy and becomes very short of breath when exertion. We therefore discussed hospice services at home on discharge. Discussed focus on comfort, quality, dignity. Educated on medications as need for symptom management (xanax/roxanol) and improve quality of life with chronic disease. Patient feels this is the best option. Family agrees and is very supportive.   Patient/family share stories and pictures of her grandson, who brings her joy. Therapeutic listening and emotional/spiritual support provided.    Length of Stay: 3  Current Medications: Scheduled Meds:  . ALPRAZolam  0.5 mg Oral BID  . docusate sodium  100 mg Oral BID  . enoxaparin (LOVENOX) injection  40 mg Subcutaneous Q24H  . ipratropium-albuterol  3 mL Nebulization Q4H  . mouth rinse  15 mL Mouth Rinse BID  . methylPREDNISolone (SOLU-MEDROL) injection  60 mg Intravenous  Q6H  . nicotine  14 mg Transdermal Daily    Continuous Infusions: . sodium chloride 75 mL/hr at 11/20/16 1029  . famotidine (PEPCID) IV Stopped (11/19/16 2250)    PRN Meds: acetaminophen **OR** acetaminophen, bisacodyl, HYDROcodone-acetaminophen, morphine CONCENTRATE, ondansetron **OR** ondansetron (ZOFRAN) IV, traZODone  Physical Exam  Constitutional: She is oriented to person, place, and time. She is cooperative. She appears ill.  HENT:  Head: Normocephalic and atraumatic.  Cardiovascular: Regular rhythm.   Pulmonary/Chest: No accessory muscle usage. No tachypnea. No respiratory distress. She has decreased breath sounds.  Intermittent dyspnea at rest  Abdominal: Normal appearance.  Neurological: She is alert and oriented to person, place, and time.  Skin: Skin is  warm and dry.  Psychiatric: Her speech is normal and behavior is normal. Her mood appears anxious. Cognition and memory are normal.  Nursing note and vitals reviewed.          Vital Signs: BP (!) 164/80   Pulse 78   Temp 98.1 F (36.7 C) (Oral)   Resp 20   Ht 5\' 2"  (1.575 m)   Wt 65.7 kg (144 lb 14.4 oz)   SpO2 94%   BMI 26.50 kg/m  SpO2: SpO2: 94 % O2 Device: O2 Device: Nasal Cannula O2 Flow Rate: O2 Flow Rate (L/min): 2 L/min  Intake/output summary:   Intake/Output Summary (Last 24 hours) at 11/20/16 1106 Last data filed at 11/20/16 0808  Gross per 24 hour  Intake             1710 ml  Output             3915 ml  Net            -2205 ml   LBM: Last BM Date:  (unknown) Baseline Weight: Weight: 72.2 kg (159 lb 2.8 oz) Most recent weight: Weight: 65.7 kg (144 lb 14.4 oz)  Palliative Assessment/Data: PPS 40%   Flowsheet Rows     Most Recent Value  Intake Tab  Referral Department  Critical care  Unit at Time of Referral  ICU  Palliative Care Primary Diagnosis  Pulmonary  Palliative Care Type  Return patient Palliative Care  Date first seen by Palliative Care  11/18/16  Clinical Assessment    Palliative Performance Scale Score  40%  Psychosocial & Spiritual Assessment  Palliative Care Outcomes  Patient/Family meeting held?  Yes  Who was at the meeting?  patient, daughters, husband  Palliative Care Outcomes  Clarified goals of care, Provided end of life care assistance, Provided psychosocial or spiritual support, Improved pain interventions, Improved non-pain symptom therapy, Counseled regarding hospice      Patient Active Problem List   Diagnosis Date Noted  . Shortness of breath   . Palliative care by specialist   . Acute respiratory failure ([place]) 11/17/2016  . Palliative care encounter   . Goals of care, counseling/discussion   . DNR (do not resuscitate) discussion   . Tobacco use 11/02/2012  . Anxiety 02/25/2012  . Hypercholesterolemia 10/07/2011  . COPD exacerbation ([place]) 08/28/2011  . Osteoarthritis 08/28/2011    Palliative Care Assessment & Plan   Patient Profile: 56 y.o. female  with past medical history of COPD on home oxygen 4-5L, smoker, anxiety, urinary incontinence, hypercholesteremia, cervical cancer s/p hysterectomy admitted on 11/17/2016 with unresponsiveness from home. EMS found oxygen saturation in 50's. In ED, patient was intubated and sedated. Also with hypotension and given 4L [place]. Chest xray reveals pulmonary hyperinflation. Lactic acid 2.3 Procalcitonin <0.10. Extubated 8/9 and now on 5L . Palliative medicine consultation for goals of care.   Assessment: Acute on chronic respiratory failure COPD exacerbation Metabolic encephalopathy resolved Hyperglycemia Hyponatremia Thrombocytopenia  Recommendations/Plan:  Patient confirms DNR/DNI. No re-intubation if she should decompensate.   Anxiety: Xanax 0.5mg  PO BID  Dyspnea/Pain: Roxanol 10mg  SL q3h prn   Patient wishes to focus on comfort. Continue current interventions but discharge home with hospice services when medically stable.   RN CM notified of patient/family interest in hospice  services.   PMT not at [facility] over the weekend but will f/u next week if still hospitalized.   Code Status: DNR/DNI   Code Status Orders        Start  Ordered   11/17/16 1707  Do not attempt resuscitation (DNR)  Continuous    Question Answer Comment  In the event of cardiac or respiratory ARREST Do not call a "code blue"   In the event of cardiac or respiratory ARREST Do not perform Intubation, CPR, defibrillation or ACLS   In the event of cardiac or respiratory ARREST Use medication by any route, position, wound care, and other measures to relive pain and suffering. May use oxygen, suction and manual treatment of airway obstruction as needed for comfort.      11/17/16 1710    Code Status History    Date Active Date Inactive Code Status Order ID Comments User Context   08/14/2016  5:01 PM 11/17/2016  2:07 PM DNR [id number] Confirmed DNR.  Patient and husband verbalize understanding to withhold intubation, CPR, defibrillation, and ACLS protocol. Mikey College, NP Outpatient   03/06/2016  1:59 PM 03/08/2016  2:31 PM DNR [id number]  Basilio Cairo, NP Inpatient   03/05/2016  1:36 AM 03/06/2016  1:59 PM Full Code [id number]  Hugelmeyer, Ubaldo Glassing, DO Inpatient    Advance Directive Documentation     Most Recent Value  Type of Advance Directive  Out of facility DNR (pink MOST or yellow form)  Pre-existing out of facility DNR order (yellow form or pink MOST form)  Yellow form placed in chart (order not valid for inpatient use)  "MOST" Form in Place?  -       Prognosis:   Unable to determine: guarded with acute on chronic respiratory failure, severe COPD, and risk for decompensation requiring intubation. Patient does NOT want to be re-intubated. Wishes to focus on comfort.   Discharge Planning:  Home with Hospice  Care plan was discussed with patient, family, RN  Thank you for allowing the Palliative Medicine Team to assist in the care of this patient.   Time In: 1045- 1200  Time Out: 1110 1230 Total Time 57min Prolonged Time Billed  no      Greater than 50%  of this time was spent counseling and coordinating care related to the above assessment and plan.  Ihor Dow, FNP-C Palliative Medicine Team  Phone: [phone] Fax: [phone]  Please contact Palliative Medicine Team phone at [phone] for questions and concerns.

## 2016-11-20 NOTE — Evaluation (Signed)
Clinical/Bedside Swallow Evaluation Patient Details  Name: Patty Coleman MRN: [id number] Date of Birth: 01-Oct-1960  Today's Date: 11/20/2016 Time: SLP Start Time (ACUTE ONLY): 62 SLP Stop Time (ACUTE ONLY): 1050 SLP Time Calculation (min) (ACUTE ONLY): 30 min  Past Medical History:  Past Medical History:  Diagnosis Date  . Anxiety   . Cancer (HCC)    cervical stage I  . COPD (chronic obstructive pulmonary disease) ([place])   . Hypercholesteremia    previously on medications  . Urinary incontinence    Past Surgical History:  Past Surgical History:  Procedure Laterality Date  . ABDOMINAL HYSTERECTOMY    . CESAREAN SECTION     HPI:      Assessment / Plan / Recommendation Clinical Impression  pt presents with minimal risk of aspiration as characterized by no vert ssx aspiration with thin liquids via cup or straw with multiple sips utilized via straw. pt had no overt ssx in oral or pharyngeal  phase. Mastication, and a-p transit appeared [facility]. pt did state that she was hungry and wanted meal. SLP educated on meal options and assisted in ordering breakfast and making good choices. SLP educated on strategies to continue to minize the risk of aspiration such as sit at 90 degrees and eat slowly. pt able to agree to strategies and family verbally agreed to [employer]. NSG educated on ST findings and educated to inform ST if changes in condition occur. ST will sign off at this time unless further problems present.  SLP Visit Diagnosis: Dysphagia, oropharyngeal phase (R13.12)    Aspiration Risk  No limitations    Diet Recommendation Regular;Thin liquid   Liquid Administration via: Straw;Cup Medication Administration: Whole meds with puree Supervision: Patient able to self feed Compensations: Slow rate;Small sips/bites;Follow solids with liquid Postural Changes: Seated upright at 90 degrees    Other  Recommendations     Follow up Recommendations        Frequency and  Duration            Prognosis        Swallow Study   General Date of Onset: 11/19/16 Type of Study: Bedside Swallow Evaluation Diet Prior to this Study: NPO Temperature Spikes Noted: No Respiratory Status: Nasal cannula History of Recent Intubation: Yes Length of Intubations (days): 3 days Date extubated: 11/19/16 Behavior/Cognition: Alert;Cooperative Oral Cavity Assessment: Within Functional Limits Oral Care Completed by SLP: No Oral Cavity - Dentition: Adequate natural dentition Vision: Functional for self-feeding Self-Feeding Abilities: Able to feed self Patient Positioning: Upright in bed Baseline Vocal Quality: Normal Volitional Cough: Strong Volitional Swallow: Able to elicit    Oral/Motor/Sensory Function Overall Oral Motor/Sensory Function: Within functional limits   Ice Chips Ice chips: Within functional limits Presentation: Self Fed;Spoon;Cup   Thin Liquid Thin Liquid: Within functional limits Presentation: Cup;Self Fed;Straw    Nectar Thick Nectar Thick Liquid: Not tested   Honey Thick Honey Thick Liquid: Not tested   Puree Puree: Within functional limits Presentation: Self Fed;Spoon   Solid   GO   Solid: Within functional limits Presentation: Self Fed;Spoon        [place] Sauber 11/20/2016,11:14 AM

## 2016-11-20 NOTE — Progress Notes (Signed)
Pt. C/o of pain. Sublingual Morphine with no relief at 2000. Pt currently NPO. NP verbal order to writing nurse to start pt with sips of water, then small bite of applesauce, then crush PO meds and place in applesauce. Pt tolerated well. No swallowing issues. Pt continues with ice chips, tolerating well.

## 2016-11-20 NOTE — Progress Notes (Signed)
PULMONARY / CRITICAL CARE MEDICINE   Name: Patty Coleman MRN: [id number] DOB: [birth year]    ADMISSION DATE:  11/17/2016  CONSULTATION DATE: 11/17/16  REFERRING MD: Dr. Sissy Hoff  CHIEF COMPLAINT: Loss of Conciousness  HISTORY OF PRESENT ILLNESS:   Patty Coleman is a 56 yo female with known history of severe COPD uses 4-5 liters of O2, Tobacco abuse and Hypercholesteremia.  Patient was found unresponsive by family members and EMS was called.  When EMS arrived on the scene her O2 sats were down to 50%.  Patient was brought to the ED where she was intubated .  Husband brought her DNR paperwork later and wishes her to be intubated for short period of time.  However in the case of Cardiac arrest he would not want any chest compression. Patient is placed in the ICU for further management.  SUBJECTIVE: Patient remains extubated overnight.  Was afebrile. Had an uneventful night.  VITAL SIGNS: BP 128/62   Pulse 70   Temp 98 F (36.7 C) (Oral)   Resp 17   Ht 5\' 6"  (1.676 m)   Wt 72 kg (158 lb 11.7 oz)   SpO2 95%   BMI 25.62 kg/m    INTAKE / OUTPUT: I/O last 3 completed shifts: In: 3872.8 [P.O.:60; I.V.:3762.8; IV Piggyback:50] Out: 8413 [Urine:2920; Emesis/NG output:550]  PHYSICAL EXAMINATION: General:Caucasian female, in no acute distress Neuro:Awake, Alert and oriented HEENT: AT,[place], No JVD,  Cardiovascular: s1s2,regular, no m/r/g Lungs:  Diminished bibasilar, no rhonchi,crackles Abdomen:  Obese,round,NT Musculoskeletal:No edema,cyanosis Skin: warm, dry and intact  LABS:  BMET  Recent Labs Lab 11/17/16 1418 11/18/16 0534  NA 133* 136  K 3.9 3.6  CL 80* 95*  CO2 44* 32  BUN 15 18  CREATININE 0.47 0.73  GLUCOSE 157* 155*    Electrolytes  Recent Labs Lab 11/17/16 1418 11/18/16 0534  CALCIUM 9.0 8.1*    CBC  Recent Labs Lab 11/17/16 1418 11/18/16 0534  WBC 9.1 6.8  HGB 11.4* 9.5*  HCT 35.0 29.3*  PLT 197 142*    Coag's  Recent  Labs Lab 11/17/16 1418  INR 0.90    Sepsis Markers  Recent Labs Lab 11/17/16 1418 11/17/16 1720 11/17/16 2201 11/18/16 0534 11/19/16 0617  LATICACIDVEN 0.8 2.3*  --   --   --   PROCALCITON  --   --  <0.10 <0.10 <0.10    ABG  Recent Labs Lab 11/17/16 1708 11/19/16 1050  PHART 7.27* 7.45  PCO2ART 89* 55*  PO2ART 95 56*    Liver Enzymes  Recent Labs Lab 11/17/16 1418  AST 16  ALT 7*  ALKPHOS 54  BILITOT 0.7  ALBUMIN 4.3    Cardiac Enzymes  Recent Labs Lab 11/17/16 1418  TROPONINI <0.03    Glucose  Recent Labs Lab 11/17/16 1759 11/17/16 2152 11/18/16 0707 11/19/16 0724  GLUCAP 96 107* 160* 133*    Imaging No results found.   STUDIES:  11/17/16 CT head>>No evidence of acute intracranial hemorrhage or large territory infarct.Slight effacement of the extra-axial CSF spaces compared to the2009 examination which may reflect increased intracranial pressuresuch as from early cerebral edema.. Right greater than left periorbital soft tissue swelling.  CULTURES: 11/17/16 BC>> 11/17/16 UC>>  ANTIBIOTICS: None  SIGNIFICANT EVENTS: 11/17/16 Patient intubated and on mechanical ventilation admitted to ICU  LINES/TUBES: 11/17/16 ET tube>> 8/9 Extubated  ASSESSMENT / PLAN:  PULMONARY A: Acute On Chronic Respiratory Failure secondary to AECOPD- Resolved Heavy Tobacco Abuse. Extubate on 8/9 P:   Support  with O2 to keep sats>88% Continue Bronchodilators Continue Steroids Patient is a DNR and does not want to be reintubated at any circumstance Added Nicotine Tobacco cessation Councelling  CARDIOVASCULAR A:  Hypotension- Resolved P:  Continuous Telemetry Keep MAP goal>60   RENAL A:   No active issues P:   Monitor I/O F/u BMET intermittently Replace electrolytes per usual guidelines.  GASTROINTESTINAL A:   No active issues P:    will get Swallow eval and if passes wil order a diet as tolerated   HEMATOLOGIC A:   No active issues P:   Lovenox for DVT prophylaxis Transfuse if Hgb<7  INFECTIOUS A:   Lactic acidosis- Resolved P:   Follow cultures Monitor fever,CBC   ENDOCRINE A:   No active issues P:   Check Blood glucose intermittently with BMP  NEUROLOGIC A:   Vent associated discomfort- resolved CT head indicative of early cerebral edema Right Ankle swelling Anxiety P:   Norco for pain PRN lorazepam    Frutoso Dimare,AG-ACNP Pulmonary & Critical Care

## 2016-11-20 NOTE — Progress Notes (Addendum)
Nutrition Follow Up Note   DOCUMENTATION CODES:   Not applicable  INTERVENTION:   Ensure Enlive po BID, each supplement provides 350 kcal and 20 grams of protein  Magic cup TID with meals, each supplement provides 290 kcal and 9 grams of protein  NUTRITION DIAGNOSIS:   Inadequate oral intake related to inability to eat as evidenced by NPO status.  -resolving  GOAL:   Patient will meet greater than or equal to 90% of their needs  MONITOR:   PO intake, Supplement acceptance, Labs, Weight trends  ASSESSMENT:   56 year old female with PMHx of COPD, stage I cervical cancer s/p abdominal hysterectomy, hypercholesteremia, anxiety who was found unresponsive by family members and emergently intubated on 8/7. Patient with acute respiratory failure from acute encephalopathy from probable anoxic brain injury in setting of end-stage COPD. CT head 8/7 showing increased intracranial pressure possibly from early cerebral edema.   Pt extubated 8/9. SLP eval today with CSE; pt initiated on regular diet. Pt with good appetite today and requesting food. Pt with wt loss of 15lbs(9%) since admit; RD unsure how much of this is true weight loss. Per palliative care note, pt would like to focus on comfort and would not wish to be re-intubated. RD will order supplements for pt comfort.    Medications reviewed and include: loveno, colace, solu-medrol, nicotine, pepcid  Labs reviewed: No new labs  Diet Order:  Diet regular Room service appropriate? Yes; Fluid consistency: Thin  Skin:  Wound (see comment) (skin tear right elbow; right foot mottled)  Last BM:  pta  Height:   Ht Readings from Last 1 Encounters:  11/20/16 5\' 2"  (1.575 m)    Weight:   Wt Readings from Last 1 Encounters:  11/20/16 144 lb 14.4 oz (65.7 kg)    Ideal Body Weight:  59.1 kg  BMI:  Body mass index is 26.5 kg/m.  Estimated Nutritional Needs:   Kcal:  1700-2000kcal/day   Protein:  86-99g/day   Fluid:   >1.2L/day   EDUCATION NEEDS:   No education needs identified at this time  Koleen Distance MS, RD, [place] Pager #[phone] After Hours Pager: [phone]

## 2016-11-20 NOTE — Progress Notes (Signed)
[place] [facility] [place] NAME: Patty Coleman    MR#:  [id number]  DATE OF BIRTH:  Oct 11, 1960  SUBJECTIVE:  CHIEF COMPLAINT:   Chief Complaint  Patient presents with  . Loss of Consciousness  The patient is a 56 year old Caucasian female with past medical history significant for history of severe COPD, heavy tobacco abuse, who was found to be unresponsive by family members, EMS was called, her O2 sats was in 34s, she was brought to emergency room and intubated, admitted to intensive care unit.  Intensivist/pulmonologist recommended to continue ventilatory support , discussed with patient's family and removed the T-tube today. Patient was extubated, placed on 6 L of oxygen, tolerates it well, with oxygen saturations is 93%, according to medical records. The patient was seen to for extubation. Anxiety attack prior to extubation, requiring some medications to calm patient down  pts sob is better, on 2 lit of oxygen, has plans to quit smoking , 2 daughters at bed side  Review of Systems  Constitutional: Negative for chills and fever.  HENT: Negative for hearing loss, nosebleeds and tinnitus.   Respiratory: Positive for cough, sputum production and shortness of breath.   Cardiovascular: Negative for chest pain, orthopnea and PND.  Gastrointestinal: Negative for heartburn, nausea and vomiting.  Musculoskeletal: Negative for back pain and neck pain.  Neurological: Positive for weakness. Negative for dizziness, tingling and headaches.  Psychiatric/Behavioral: Negative for depression and hallucinations. The patient does not have insomnia.     VITAL SIGNS: Blood pressure (!) 164/80, pulse 78, temperature 98.1 F (36.7 C), temperature source Oral, resp. rate 20, height 5\' 2"  (1.575 m), weight 65.7 kg (144 lb 14.4 oz), SpO2 94 %.  PHYSICAL EXAMINATION:   GENERAL:  56 y.o.-year-old patient lying in the bed with no acute distress,  intubated, sedated, sleeping  EYES: Pupils equal, round, reactive to light and accommodation. No scleral icterus. Extraocular muscles intact.  HEENT: Head atraumatic, normocephalic. Oropharynx and nasopharynx clear.  NECK:  Supple, no jugular venous distention. No thyroid enlargement, no tenderness.  LUNGS: Some diminished breath sounds bilaterally, no wheezing, rales,rhonchi or crepitation. No use of accessory muscles of respiration.  CARDIOVASCULAR: S1, S2 normal. No murmurs, rubs, or gallops.  ABDOMEN: Soft, nontender, nondistended. Bowel sounds present. No organomegaly or mass.  EXTREMITIES: No pedal edema, cyanosis, or clubbing.  NEUROLOGIC: Cranial nerves II through XII are intact. Muscle strength 5/5 in all extremities. Sensation intact. Gait not checked.  PSYCHIATRIC: The patient is sedated, sleepy SKIN: No obvious rash, lesion, or ulcer.   ORDERS/RESULTS REVIEWED:   CBC  Recent Labs Lab 11/17/16 1418 11/18/16 0534  WBC 9.1 6.8  HGB 11.4* 9.5*  HCT 35.0 29.3*  PLT 197 142*  MCV 88.6 88.3  MCH 28.9 28.7  MCHC 32.6 32.5  RDW 13.9 13.5  LYMPHSABS 0.7*  --   MONOABS 0.3  --   EOSABS 0.1  --   BASOSABS 0.0  --    ------------------------------------------------------------------------------------------------------------------  Chemistries   Recent Labs Lab 11/17/16 1418 11/18/16 0534  NA 133* 136  K 3.9 3.6  CL 80* 95*  CO2 44* 32  GLUCOSE 157* 155*  BUN 15 18  CREATININE 0.47 0.73  CALCIUM 9.0 8.1*  AST 16  --   ALT 7*  --   ALKPHOS 54  --   BILITOT 0.7  --    ------------------------------------------------------------------------------------------------------------------ estimated creatinine clearance is 69.8 mL/min (by C-G formula based on SCr of 0.73 mg/dL). ------------------------------------------------------------------------------------------------------------------  No results for input(s): TSH, T4TOTAL, T3FREE, THYROIDAB in the last 72  hours.  Invalid input(s): FREET3  Cardiac Enzymes  Recent Labs Lab 11/17/16 1418  TROPONINI <0.03   ------------------------------------------------------------------------------------------------------------------ Invalid input(s): POCBNP ---------------------------------------------------------------------------------------------------------------  RADIOLOGY: No results found.  EKG:  Orders placed or performed during the hospital encounter of 11/17/16  . EKG 12-Lead  . EKG 12-Lead    ASSESSMENT AND PLAN:  Active Problems:   Acute respiratory failure ([place])   Palliative care by specialist   Shortness of breath #1. Acute on chronic respiratory failure with hypoxia and hypercapnia  status post intubation and mechanical ventilation, Now extubated,   continue Solu Medrol, due nebs. Patient does not want to be reintubated.  #2. Metabolic encephalopathy, due to CO2 narcosis, resolved   #3. Hyperglycemia, hemoglobin, A1c is pending, likely stress, steroid related  #4. Hyponatremia, resolved  #5  thrombocytopenia, follow closely  # 6 Anxiety - xanax   Management plans discussed with the patient, family and they are in agreement.   DRUG ALLERGIES:  Allergies  Allergen Reactions  . Sulfa Antibiotics Swelling    angioedema  . Eggs Or Egg-Derived Products Hives and Nausea Only  . Lincocin [Lincomycin Hcl] Hives    CODE STATUS:     Code Status Orders        Start     Ordered   11/17/16 1707  Do not attempt resuscitation (DNR)  Continuous    Question Answer Comment  In the event of cardiac or respiratory ARREST Do not call a "code blue"   In the event of cardiac or respiratory ARREST Do not perform Intubation, CPR, defibrillation or ACLS   In the event of cardiac or respiratory ARREST Use medication by any route, position, wound care, and other measures to relive pain and suffering. May use oxygen, suction and manual treatment of airway obstruction as needed for  comfort.      11/17/16 1710    Code Status History    Date Active Date Inactive Code Status Order ID Comments User Context   08/14/2016  5:01 PM 11/17/2016  2:07 PM DNR [id number] Confirmed DNR.  Patient and husband verbalize understanding to withhold intubation, CPR, defibrillation, and ACLS protocol. Mikey College, NP Outpatient   03/06/2016  1:59 PM 03/08/2016  2:31 PM DNR [id number]  Basilio Cairo, NP Inpatient   03/05/2016  1:36 AM 03/06/2016  1:59 PM Full Code [id number]  Hugelmeyer, Ubaldo Glassing, DO Inpatient    Advance Directive Documentation     Most Recent Value  Type of Advance Directive  Out of facility DNR (pink MOST or yellow form)  Pre-existing out of facility DNR order (yellow form or pink MOST form)  Yellow form placed in chart (order not valid for inpatient use)  "MOST" Form in Place?  -      TOTAL TIME TAKING CARE OF THIS PATIENT: 35 minutes.    Nicholes Mango M.D on 11/20/2016 at 1:13 PM  Between 7am to 6pm - Pager - [phone]  After 6pm go to [email or web address] - password [email or web address] [facility]  Office  [phone]  CC: Primary care physician; Allyne Gee, MD

## 2016-11-20 NOTE — Progress Notes (Signed)
New referral for hospice at home on 56yo female who was admitted to [facility] on 8.7.68 with COPD exacerbation.  Patient was found unresponsive with O2 sats in the 50's.  She was intubated in the field by EMS and transported to [facility] ED.  Medical history:  COPD- O2 dependent, hypercholestermia, Cervical Cancer stage I, Anxiety.   She is currently served under in home palliative medicine through our agency.  Patient is post goals of care discussion at [facility] and at this time, she would like to have in home hospice.  She states she would like to be in her home with no further hospitalizations if possible and be comfortable and enjoy the time she has left with her family.  She has a 56 year old Patty Coleman- that is her motivation to stop smoking and to have more time.  Upon initial visit, Patty Coleman- is sitting in the bed with O2 via [place] @5L .  She is short of breat at rest.  She is awake, alert and oriented and is very talkative.  Her twin daughters- Patty Coleman and Patty Coleman are here from [place] are present for hospice discussion.  Patient is married to Patty Coleman who is not present.  After discussion of hospice services, she request to proceed with referral.  Patient has the following DME in the home:  Oxygen from [facility] (is okay with changing out if her husband is), BSC and nebulizer.  DME needs:  Over bed table and shower bench- (RN to assess for DME at admission and then order all DME including O2 change out).  Patient has living will that Patty Coleman assisted her in completing.  No plans for discharge over the weekend.  All hospital information faxed to referral intake.  Clinical nurse liaison will follow up on Monday and assist with any discharge needs.  Thank you for allowing participation in this patient's care.  Patty Aguas, RN Clinical Nurse Liaison [facility] [place]

## 2016-11-21 LAB — GLUCOSE, CAPILLARY: Glucose-Capillary: 167 mg/dL — ABNORMAL HIGH (ref 65–99)

## 2016-11-21 MED ORDER — SODIUM CHLORIDE 0.9% FLUSH
3.0000 mL | INTRAVENOUS | Status: DC | PRN
  Administered 2016-11-21: 3 mL via INTRAVENOUS
  Filled 2016-11-21: qty 3

## 2016-11-21 MED ORDER — SODIUM CHLORIDE 0.9% FLUSH
3.0000 mL | Freq: Two times a day (BID) | INTRAVENOUS | Status: DC
Start: 2016-11-21 — End: 2016-11-23
  Administered 2016-11-21 – 2016-11-23 (×4): 3 mL via INTRAVENOUS

## 2016-11-21 MED ORDER — METHYLPREDNISOLONE SODIUM SUCC 125 MG IJ SOLR
60.0000 mg | Freq: Three times a day (TID) | INTRAMUSCULAR | Status: DC
  Administered 2016-11-21 – 2016-11-22 (×3): 60 mg via INTRAVENOUS
  Filled 2016-11-21 (×3): qty 2

## 2016-11-21 MED ORDER — ZOLPIDEM TARTRATE 5 MG PO TABS
5.0000 mg | ORAL_TABLET | Freq: Every evening | ORAL | Status: DC | PRN
Start: 2016-11-21 — End: 2016-11-23
  Administered 2016-11-21 – 2016-11-22 (×3): 5 mg via ORAL
  Filled 2016-11-21 (×3): qty 1

## 2016-11-21 MED ORDER — GUAIFENESIN ER 600 MG PO TB12
600.0000 mg | ORAL_TABLET | Freq: Two times a day (BID) | ORAL | Status: DC
  Administered 2016-11-21 – 2016-11-23 (×5): 600 mg via ORAL
  Filled 2016-11-21 (×5): qty 1

## 2016-11-21 NOTE — Care Management Note (Signed)
Case Management Note  Patient Details  Name: Patty Coleman MRN: [id number] Date of Birth: 1960/04/18  Subjective/Objective:      A heads up referral for home with [facility] to follow in [name]'s home was called and faxed to Lorelle Formosa at [facility] and [place]. Anticipate discharge home tomorrow.                Action/Plan:   Expected Discharge Date:                  Expected Discharge Plan:     In-House Referral:     Discharge planning Services     Post Acute Care Choice:    Choice offered to:     DME Arranged:    DME Agency:     HH Arranged:    HH Agency:     Status of Service:     If discussed at [employer] of Stay Meetings, dates discussed:    Additional Comments:  Diandre Merica A, RN 11/21/2016, 2:01 PM

## 2016-11-21 NOTE — Progress Notes (Signed)
Dr. Jannifer Franklin paged per pt request for sleep med, had already received PRN PO Trazodone 25mg .  PRN Trazodone d/c'ed, pt received ordered PRN PO Ambien 5mg .

## 2016-11-21 NOTE — Progress Notes (Addendum)
Pt today has been up to [facility] to void with 1+ and tolerated well with no respiratory distress. Pt responds to emotional support due to anxiety state. Family has been at bedside today. 02 continued at her base rate. Lungs clear/diminished.  Ducolax tab given for constipation x 4 days in addition to ordered stool softeners- will report to oncoming nurse to followup.

## 2016-11-21 NOTE — Progress Notes (Signed)
[place] at [place] NAME: Patty Coleman    MR#:  [id number]  DATE OF BIRTH:  December 07, 1960  SUBJECTIVE:  CHIEF COMPLAINT:   Chief Complaint  Patient presents with  . Loss of Consciousness  The patient is a 56 year old Caucasian female with past medical history significant for history of severe COPD, heavy tobacco abuse, who was found to be unresponsive by family members, EMS was called, her O2 sats was in 92s, she was brought to emergency room and intubated, admitted to intensive care unit.  Intensivist/pulmonologist recommended to continue ventilatory support , discussed with patient's family and removed the T-tube today. Patient was extubated, placed on 6 L of oxygen, tolerates it well, with oxygen saturations is 93%, according to medical records. The patient was seen to for extubation. Anxiety attack prior to extubation, requiring some medications to calm patient down  pts sob is better, Less anxious today on 2 lit of oxygen, has plans to quit smoking Requesting home hospice at home  Review of Systems  Constitutional: Negative for chills and fever.  HENT: Negative for hearing loss, nosebleeds and tinnitus.   Respiratory: Positive for cough, sputum production and shortness of breath.   Cardiovascular: Negative for chest pain, orthopnea and PND.  Gastrointestinal: Negative for heartburn, nausea and vomiting.  Musculoskeletal: Negative for back pain and neck pain.  Neurological: Positive for weakness. Negative for dizziness, tingling and headaches.  Psychiatric/Behavioral: Negative for depression and hallucinations. The patient does not have insomnia.     VITAL SIGNS: Blood pressure (!) 139/48, pulse 66, temperature 97.8 F (36.6 C), temperature source Oral, resp. rate 19, height 5\' 2"  (1.575 m), weight 67.6 kg (149 lb), SpO2 93 %.  PHYSICAL EXAMINATION:   GENERAL:  56 y.o.-year-old patient lying in the bed with no acute  distress, intubated, sedated, sleeping  EYES: Pupils equal, round, reactive to light and accommodation. No scleral icterus. Extraocular muscles intact.  HEENT: Head atraumatic, normocephalic. Oropharynx and nasopharynx clear.  NECK:  Supple, no jugular venous distention. No thyroid enlargement, no tenderness.  LUNGS: Some diminished breath sounds bilaterally, no wheezing, rales,rhonchi or crepitation. No use of accessory muscles of respiration.  CARDIOVASCULAR: S1, S2 normal. No murmurs, rubs, or gallops.  ABDOMEN: Soft, nontender, nondistended. Bowel sounds present. No organomegaly or mass.  EXTREMITIES: No pedal edema, cyanosis, or clubbing.  NEUROLOGIC: Cranial nerves II through XII are intact. Muscle strength 5/5 in all extremities. Sensation intact. Gait not checked.  PSYCHIATRIC: The patient is sedated, sleepy SKIN: No obvious rash, lesion, or ulcer.   ORDERS/RESULTS REVIEWED:   CBC  Recent Labs Lab 11/17/16 1418 11/18/16 0534  WBC 9.1 6.8  HGB 11.4* 9.5*  HCT 35.0 29.3*  PLT 197 142*  MCV 88.6 88.3  MCH 28.9 28.7  MCHC 32.6 32.5  RDW 13.9 13.5  LYMPHSABS 0.7*  --   MONOABS 0.3  --   EOSABS 0.1  --   BASOSABS 0.0  --    ------------------------------------------------------------------------------------------------------------------  Chemistries   Recent Labs Lab 11/17/16 1418 11/18/16 0534  NA 133* 136  K 3.9 3.6  CL 80* 95*  CO2 44* 32  GLUCOSE 157* 155*  BUN 15 18  CREATININE 0.47 0.73  CALCIUM 9.0 8.1*  AST 16  --   ALT 7*  --   ALKPHOS 54  --   BILITOT 0.7  --    ------------------------------------------------------------------------------------------------------------------ estimated creatinine clearance is 70.8 mL/min (by C-G formula based on SCr of 0.73 mg/dL). ------------------------------------------------------------------------------------------------------------------  No results for input(s): TSH, T4TOTAL, T3FREE, THYROIDAB in the last 72  hours.  Invalid input(s): FREET3  Cardiac Enzymes  Recent Labs Lab 11/17/16 1418  TROPONINI <0.03   ------------------------------------------------------------------------------------------------------------------ Invalid input(s): POCBNP ---------------------------------------------------------------------------------------------------------------  RADIOLOGY: No results found.  EKG:  Orders placed or performed during the hospital encounter of 11/17/16  . EKG 12-Lead  . EKG 12-Lead    ASSESSMENT AND PLAN:  Active Problems:   Acute respiratory failure ([place])   Palliative care by specialist   Shortness of breath #1. Acute on chronic respiratory failure with hypoxia and hypercapnia  status post intubation and mechanical ventilation, Now extubated,   continue Solu Medrol, Taper as tolerated , continue due nebs. Patient does not want to be reintubated. Patient wants hospice care at home  #2. Metabolic encephalopathy, due to CO2 narcosis, resolved   #3. Hyperglycemia, hemoglobin, A1c is pending, likely stress, steroid related  #4. Hyponatremia, resolved  #5  thrombocytopenia, follow closely  # 6 Anxiety - xanax   Management plans discussed with the patient, family and they are in agreement. Anticipating to discharge home with hospice care 1-2 days   DRUG ALLERGIES:  Allergies  Allergen Reactions  . Sulfa Antibiotics Swelling    angioedema  . Eggs Or Egg-Derived Products Hives and Nausea Only  . Lincocin [Lincomycin Hcl] Hives    CODE STATUS:     Code Status Orders        Start     Ordered   11/17/16 1707  Do not attempt resuscitation (DNR)  Continuous    Question Answer Comment  In the event of cardiac or respiratory ARREST Do not call a "code blue"   In the event of cardiac or respiratory ARREST Do not perform Intubation, CPR, defibrillation or ACLS   In the event of cardiac or respiratory ARREST Use medication by any route, position, wound care, and  other measures to relive pain and suffering. May use oxygen, suction and manual treatment of airway obstruction as needed for comfort.      11/17/16 1710    Code Status History    Date Active Date Inactive Code Status Order ID Comments User Context   08/14/2016  5:01 PM 11/17/2016  2:07 PM DNR [id number] Confirmed DNR.  Patient and husband verbalize understanding to withhold intubation, CPR, defibrillation, and ACLS protocol. Mikey College, NP Outpatient   03/06/2016  1:59 PM 03/08/2016  2:31 PM DNR [id number]  Basilio Cairo, NP Inpatient   03/05/2016  1:36 AM 03/06/2016  1:59 PM Full Code [id number]  Hugelmeyer, Ubaldo Glassing, DO Inpatient    Advance Directive Documentation     Most Recent Value  Type of Advance Directive  Out of facility DNR (pink MOST or yellow form)  Pre-existing out of facility DNR order (yellow form or pink MOST form)  Yellow form placed in chart (order not valid for inpatient use)  "MOST" Form in Place?  -      TOTAL TIME TAKING CARE OF THIS PATIENT: 35 minutes.    Nicholes Mango M.D on 11/21/2016 at 9:20 AM  Between 7am to 6pm - Pager - [phone]  After 6pm go to [email or web address] - password [email or web address] [facility]  Office  [phone]  CC: Primary care physician; Allyne Gee, MD

## 2016-11-21 NOTE — Care Management Note (Signed)
Case Management Note  Patient Details  Name: Patty Coleman MRN: [id number] Date of Birth: 20-Nov-1960  Subjective/Objective:     Discussed discharge planning with Mr Frutoso Chase who reported that he had just spoken with Dr Margaretmary Eddy on the phone. Husband states that he needs time to get the house ready for [name] to return which may take until Monday. Husband states that there are 5 large oxygen tanks and other equipment that need to be removed before hospice can bring in their equipment. Husband stated that [name] was a DNR and should not have been resusciitated by EMS or in the ED.This Probation officer allowed husband to vent and provided reassurance. A referral was faxed to Lorelle Formosa at [facility] earlier today. Case Management will continue to follow for discharge planning.               Action/Plan:   Expected Discharge Date:                  Expected Discharge Plan:  Home w Hospice Care  In-House Referral:  Hospice / Palliative Care  Discharge planning Services  CM Consult  Post Acute Care Choice:  Hospice ([facility]) Choice offered to:  Patient, Spouse  DME Arranged:  Hospice Equipment Package Others DME Agency:  Other - Comment ([facility] [place])  HH Arranged:   (Hospice home services. Hospice will assess and provide.) [facility] Agency:  [facility] [place]  Status of Service:  In process, will continue to follow  If discussed at Long Length of Stay Meetings, dates discussed:    Additional Comments:  Reylene Stauder A, RN 11/21/2016, 3:43 PM

## 2016-11-21 NOTE — Plan of Care (Signed)
Problem: Education: Goal: Knowledge of  General Education information/materials will improve Outcome: Progressing VSS, free of falls during shift.  Reported back pain 7/10, improved to 4/10 w/ PRN SL Roxanol 10mg  x1.  Received PRN PO Trazodone 25mg  x1, PRN PO Ambien 5mg  x1 for sleep.  No other needs overnight.  Bed in low position, call bell within reach.  WCTM.

## 2016-11-22 LAB — CULTURE, BLOOD (ROUTINE X 2): CULTURE: NO GROWTH

## 2016-11-22 LAB — GLUCOSE, CAPILLARY: GLUCOSE-CAPILLARY: 134 mg/dL — AB (ref 65–99)

## 2016-11-22 MED ORDER — METHYLPREDNISOLONE SODIUM SUCC 40 MG IJ SOLR
40.0000 mg | Freq: Two times a day (BID) | INTRAMUSCULAR | Status: DC
  Administered 2016-11-22 – 2016-11-23 (×2): 40 mg via INTRAVENOUS
  Filled 2016-11-22 (×2): qty 1

## 2016-11-22 MED ORDER — BISACODYL 10 MG RE SUPP
10.0000 mg | Freq: Every day | RECTAL | Status: DC | PRN
  Filled 2016-11-22: qty 1

## 2016-11-22 NOTE — Progress Notes (Signed)
O2 increased to 1.5 L @ pts request as she feels more sob on 1 L. She uses 2 L @ home.

## 2016-11-22 NOTE — Plan of Care (Signed)
Problem: Physical Regulation: Goal: Ability to maintain clinical measurements within normal limits will improve Outcome: Progressing Pt refuses to wean oxygen to RA. Weaned O2 to 1L, O2 sats at 95%.  Problem: Bowel/Gastric: Goal: Will not experience complications related to bowel motility Outcome: Progressing Pt had 2xBM during the shift. Pt c/o constipation. Dulcolax supp ordered per pt request, but not given. Pt verbalized that she may take it later.

## 2016-11-22 NOTE — Progress Notes (Addendum)
[place] [facility] [place] NAME: Patty Coleman    MR#:  [id number]  DATE OF BIRTH:  1961/04/06  SUBJECTIVE:  CHIEF COMPLAINT:   Chief Complaint  Patient presents with  . Loss of Consciousness  The patient is a 56 year old Caucasian female with past medical history significant for history of severe COPD, heavy tobacco abuse, who was found to be unresponsive by family members, EMS was called, her O2 sats was in 12s, she was brought to emergency room and intubated, admitted to intensive care unit.  Intensivist/pulmonologist recommended to continue ventilatory support , discussed with patient's family and removed the T-tube today. Patient was extubated, placed on 6 L of oxygen, tolerates it well, with oxygen saturations is 93%, according to medical records. The patient was seen to for extubation. Anxiety attack prior to extubation, requiring some medications to calm patient down  pts Shortness of breath is significantly improved  Patient's 2 twin daughters are living back to [place] today  Requesting home hospice at home  Review of Systems  Constitutional: Negative for chills and fever.  HENT: Negative for hearing loss, nosebleeds and tinnitus.   Respiratory: Positive for cough, sputum production and shortness of breath.   Cardiovascular: Negative for chest pain, orthopnea and PND.  Gastrointestinal: Negative for heartburn, nausea and vomiting.  Musculoskeletal: Negative for back pain and neck pain.  Neurological: Positive for weakness. Negative for dizziness, tingling and headaches.  Psychiatric/Behavioral: Negative for depression and hallucinations. The patient does not have insomnia.     VITAL SIGNS: Blood pressure (!) 160/63, pulse 82, temperature 98.2 F (36.8 C), temperature source Oral, resp. rate 20, height 5\' 2"  (1.575 m), weight 69.9 kg (154 lb), SpO2 98 %.  PHYSICAL EXAMINATION:   GENERAL:  56 y.o.-year-old patient  lying in the bed with no acute distress, intubated, sedated, sleeping  EYES: Pupils equal, round, reactive to light and accommodation. No scleral icterus. Extraocular muscles intact.  HEENT: Head atraumatic, normocephalic. Oropharynx and nasopharynx clear.  NECK:  Supple, no jugular venous distention. No thyroid enlargement, no tenderness.  LUNGS: Some diminished breath sounds bilaterally, no wheezing, rales,rhonchi or crepitation. No use of accessory muscles of respiration.  CARDIOVASCULAR: S1, S2 normal. No murmurs, rubs, or gallops.  ABDOMEN: Soft, nontender, nondistended. Bowel sounds present. No organomegaly or mass.  EXTREMITIES: No pedal edema, cyanosis, or clubbing.  NEUROLOGIC: Cranial nerves II through XII are intact. Muscle strength 5/5 in all extremities. Sensation intact. Gait not checked.  PSYCHIATRIC: The patient is sedated, sleepy SKIN: No obvious rash, lesion, or ulcer.   ORDERS/RESULTS REVIEWED:   CBC  Recent Labs Lab 11/17/16 1418 11/18/16 0534  WBC 9.1 6.8  HGB 11.4* 9.5*  HCT 35.0 29.3*  PLT 197 142*  MCV 88.6 88.3  MCH 28.9 28.7  MCHC 32.6 32.5  RDW 13.9 13.5  LYMPHSABS 0.7*  --   MONOABS 0.3  --   EOSABS 0.1  --   BASOSABS 0.0  --    ------------------------------------------------------------------------------------------------------------------  Chemistries   Recent Labs Lab 11/17/16 1418 11/18/16 0534  NA 133* 136  K 3.9 3.6  CL 80* 95*  CO2 44* 32  GLUCOSE 157* 155*  BUN 15 18  CREATININE 0.47 0.73  CALCIUM 9.0 8.1*  AST 16  --   ALT 7*  --   ALKPHOS 54  --   BILITOT 0.7  --    ------------------------------------------------------------------------------------------------------------------ estimated creatinine clearance is 71.9 mL/min (by C-G formula based on SCr of  0.73 mg/dL). ------------------------------------------------------------------------------------------------------------------ No results for input(s): TSH, T4TOTAL,  T3FREE, THYROIDAB in the last 72 hours.  Invalid input(s): FREET3  Cardiac Enzymes  Recent Labs Lab 11/17/16 1418  TROPONINI <0.03   ------------------------------------------------------------------------------------------------------------------ Invalid input(s): POCBNP ---------------------------------------------------------------------------------------------------------------  RADIOLOGY: No results found.  EKG:  Orders placed or performed during the hospital encounter of 11/17/16  . EKG 12-Lead  . EKG 12-Lead    ASSESSMENT AND PLAN:  Active Problems:   Acute respiratory failure ([place])   Palliative care by specialist   Shortness of breath #1. Acute on chronic respiratory failure with hypoxia and hypercapnia  status post intubation and mechanical ventilation, Now extubated,   continue Solu Medrol, Taper as tolerated , continue due nebs. Patient does not want to be reintubated. Patient wants hospice care at home,Discussed with 2 daughters at bedside  #2. Metabolic encephalopathy, due to CO2 narcosis, resolved   #3. Hyperglycemia, hemoglobin, A1c is pending, likely stress, steroid related  #4. Hyponatremia, resolved  #5  thrombocytopenia, follow closely  # 6 Anxiety - xanax   Management plans discussed with the patient, family and they are in agreement. Anticipating to discharge home with hospice care 1-2 days   DRUG ALLERGIES:  Allergies  Allergen Reactions  . Sulfa Antibiotics Swelling    angioedema  . Eggs Or Egg-Derived Products Hives and Nausea Only  . Lincocin [Lincomycin Hcl] Hives    CODE STATUS:     Code Status Orders        Start     Ordered   11/17/16 1707  Do not attempt resuscitation (DNR)  Continuous    Question Answer Comment  In the event of cardiac or respiratory ARREST Do not call a "code blue"   In the event of cardiac or respiratory ARREST Do not perform Intubation, CPR, defibrillation or ACLS   In the event of cardiac or  respiratory ARREST Use medication by any route, position, wound care, and other measures to relive pain and suffering. May use oxygen, suction and manual treatment of airway obstruction as needed for comfort.      11/17/16 1710    Code Status History    Date Active Date Inactive Code Status Order ID Comments User Context   08/14/2016  5:01 PM 11/17/2016  2:07 PM DNR [id number] Confirmed DNR.  Patient and husband verbalize understanding to withhold intubation, CPR, defibrillation, and ACLS protocol. Mikey College, NP Outpatient   03/06/2016  1:59 PM 03/08/2016  2:31 PM DNR [id number]  Basilio Cairo, NP Inpatient   03/05/2016  1:36 AM 03/06/2016  1:59 PM Full Code [id number]  Hugelmeyer, Ubaldo Glassing, DO Inpatient    Advance Directive Documentation     Most Recent Value  Type of Advance Directive  Out of facility DNR (pink MOST or yellow form)  Pre-existing out of facility DNR order (yellow form or pink MOST form)  Yellow form placed in chart (order not valid for inpatient use)  "MOST" Form in Place?  -      TOTAL TIME TAKING CARE OF THIS PATIENT: 28 minutes.    Nicholes Mango M.D on 11/22/2016 at 1:45 PM  Between 7am to 6pm - Pager - [phone]  After 6pm go to [email or web address] - password [email or web address] [facility]  Office  [phone]  CC: Primary care physician; Allyne Gee, MD

## 2016-11-23 LAB — GLUCOSE, CAPILLARY: GLUCOSE-CAPILLARY: 104 mg/dL — AB (ref 65–99)

## 2016-11-23 MED ORDER — ENSURE ENLIVE PO LIQD: 237.0000 mL | Freq: Two times a day (BID) | ORAL | 0 refills | Status: DC

## 2016-11-23 MED ORDER — ONDANSETRON HCL 4 MG PO TABS: 4.0000 mg | ORAL_TABLET | Freq: Four times a day (QID) | ORAL | 0 refills | Status: DC | PRN

## 2016-11-23 MED ORDER — ZOLPIDEM TARTRATE 5 MG PO TABS: 5.0000 mg | ORAL_TABLET | Freq: Every evening | ORAL | 0 refills | Status: DC | PRN

## 2016-11-23 MED ORDER — PREDNISONE 10 MG (21) PO TBPK: ORAL_TABLET | Freq: Every day | ORAL | 0 refills | Status: DC

## 2016-11-23 MED ORDER — ALPRAZOLAM 0.5 MG PO TABS: 0.5000 mg | ORAL_TABLET | Freq: Two times a day (BID) | ORAL | 0 refills | Status: DC | PRN

## 2016-11-23 MED ORDER — ACETAMINOPHEN 325 MG PO TABS: 325.0000 mg | ORAL_TABLET | Freq: Four times a day (QID) | ORAL | Status: DC | PRN

## 2016-11-23 MED ORDER — BISACODYL 5 MG PO TBEC: 5.0000 mg | DELAYED_RELEASE_TABLET | Freq: Every day | ORAL | 0 refills | Status: DC | PRN

## 2016-11-23 MED ORDER — NICOTINE 14 MG/24HR TD PT24: 14.0000 mg | MEDICATED_PATCH | Freq: Every day | TRANSDERMAL | 0 refills | Status: DC

## 2016-11-23 MED ORDER — MORPHINE SULFATE (CONCENTRATE) 10 MG/0.5ML PO SOLN: 10.0000 mg | Freq: Three times a day (TID) | ORAL | 0 refills | Status: DC | PRN

## 2016-11-23 NOTE — Discharge Summary (Addendum)
[facility] [place] NAME: Patty Coleman    MR#:  [id number]  DATE OF BIRTH:  1960-11-03  DATE OF ADMISSION:  11/17/2016 ADMITTING PHYSICIAN: Epifanio Lesches, MD  DATE OF DISCHARGE:  11/23/16 PRIMARY CARE PHYSICIAN: Allyne Gee, MD    ADMISSION DIAGNOSIS:  Acute respiratory failure, unspecified whether with hypoxia or hypercapnia (HCC) [J96.00] Altered mental status, unspecified altered mental status type [R41.82]  DISCHARGE DIAGNOSIS:  Acute on ch resp failure Acute COPD Tobaco abuse  Home hospice  SECONDARY DIAGNOSIS:   Past Medical History:  Diagnosis Date  . Anxiety   . Cancer (HCC)    cervical stage I  . COPD (chronic obstructive pulmonary disease) ([place])   . Hypercholesteremia    previously on medications  . Urinary incontinence     HOSPITAL COURSE:   Patty Coleman  is a 55 y.o. female with a known history ofSevere COPD, heavy tobacco abuse smokes about 1-1/2 pack to 2 packs a course of day, on 4-5 L of oxygen brought in by EMS. Found unresponsive by family members. And EMS arrived O2 sats of 50%. Intubated in the emergency room, she is on full vent support. Hypotensive at this time. Patient is a DO NOT RESUSCITATE but were available at the time of intubation. Husband is admitted for 1-2 days of intubation to see if she recovers patient was started on BuSpar for anxiety recently in May.  #1. Acute on chronic respiratory failure with hypoxia and hypercapnia  status post intubation and mechanical ventilation, Now extubated,   taper Solu Medrol to po prednisone continue due nebs. Patient does not want to be reintubated. Patient wants hospice care at home,Discussed with 2 daughters at bedside, agreeable to go home with home hospice  #2. Metabolic encephalopathy, due to CO2 narcosis, resolved   #3. Hyperglycemia, likely stress, steroid related  #4. Hyponatremia, resolved  #5   thrombocytopenia, no active bleeding   # 6 Anxiety - buspar   D/c with home hospice  DISCHARGE CONDITIONS:   fair  CONSULTS OBTAINED:  Treatment Team:  Flora Lipps, MD   PROCEDURES  Intubated and extubated  DRUG ALLERGIES:   Allergies  Allergen Reactions  . Sulfa Antibiotics Swelling    angioedema  . Eggs Or Egg-Derived Products Hives and Nausea Only  . Lincocin [Lincomycin Hcl] Hives    DISCHARGE MEDICATIONS:   Current Discharge Medication List    START taking these medications   Details  acetaminophen (TYLENOL) 325 MG tablet Take 1 tablet (325 mg total) by mouth every 6 (six) hours as needed for mild pain (or Fever >/= 101).    ALPRAZolam (XANAX) 0.5 MG tablet Take 1 tablet (0.5 mg total) by mouth 2 (two) times daily as needed for anxiety. Qty: 15 tablet, Refills: 0    bisacodyl (DULCOLAX) 5 MG EC tablet Take 1 tablet (5 mg total) by mouth daily as needed for moderate constipation. Qty: 30 tablet, Refills: 0    feeding supplement, ENSURE ENLIVE, (ENSURE ENLIVE) LIQD Take 237 mLs by mouth 2 (two) times daily between meals. Qty: 60 Bottle, Refills: 0    Morphine Sulfate (MORPHINE CONCENTRATE) 10 MG/0.5ML SOLN concentrated solution Place 0.5 mLs (10 mg total) under the tongue every 8 (eight) hours as needed for moderate pain, severe pain or shortness of breath (air hunger). Qty: 30 mL, Refills: 0    nicotine (NICODERM CQ - DOSED IN MG/24 HOURS) 14 mg/24hr patch Place 1 patch (14 mg total)  onto the skin daily. Qty: 28 patch, Refills: 0    ondansetron (ZOFRAN) 4 MG tablet Take 1 tablet (4 mg total) by mouth every 6 (six) hours as needed for nausea. Qty: 20 tablet, Refills: 0    predniSONE (STERAPRED UNI-PAK 21 TAB) 10 MG (21) TBPK tablet Take by mouth daily. Take 6 tablets by mouth for 1 day followed by  5 tablets by mouth for 1 day followed by  4 tablets by mouth for 1 day followed by  3 tablets by mouth for 1 day followed by  2 tablets by mouth for 1 day  followed by  1 tablet by mouth for a day and stop Qty: 21 tablet, Refills: 0    zolpidem (AMBIEN) 5 MG tablet Take 1 tablet (5 mg total) by mouth at bedtime as needed for sleep. Qty: 30 tablet, Refills: 0      CONTINUE these medications which have NOT CHANGED   Details  BREO ELLIPTA 100-25 MCG/INH AEPB Take 1 puff by mouth daily.    busPIRone (BUSPAR) 5 MG tablet Take 1 tablet (5 mg total) by mouth 3 (three) times daily. Qty: 90 tablet, Refills: 2   Associated Diagnoses: Anxiety    COMBIVENT RESPIMAT 20-100 MCG/ACT AERS respimat Take 1 puff by mouth 4 (four) times daily.    INCRUSE ELLIPTA 62.5 MCG/INH AEPB Take 1 puff by mouth daily.    ipratropium-albuterol (DUONEB) 0.5-2.5 (3) MG/3ML SOLN Take 3 mLs by nebulization every 4 (four) hours as needed (shortness of breath).    PROAIR HFA 108 (90 Base) MCG/ACT inhaler Take 2 puffs by mouth every 4 (four) hours as needed.    guaiFENesin (MUCINEX) 600 MG 12 hr tablet Take 1 tablet (600 mg total) by mouth 2 (two) times daily. Qty: 20 tablet, Refills: 0      STOP taking these medications     ibuprofen (ADVIL,MOTRIN) 200 MG tablet          DISCHARGE INSTRUCTIONS:   Home hospice at home  DIET:  Regular diet with supplements  DISCHARGE CONDITION:  Fair  ACTIVITY:  Activity as tolerated  OXYGEN:  Home Oxygen: Yes.     Oxygen Delivery: 2 liters/min via Patient connected to nasal cannula oxygen  DISCHARGE LOCATION:  home   If you experience worsening of your admission symptoms, develop shortness of breath, life threatening emergency, suicidal or homicidal thoughts you must seek medical attention immediately by calling 911 or calling your MD immediately  if symptoms less severe.  You Must read complete instructions/literature along with all the possible adverse reactions/side effects for all the Medicines you take and that have been prescribed to you. Take any new Medicines after you have completely understood and accpet  all the possible adverse reactions/side effects.   Please note  You were cared for by a hospitalist during your hospital stay. If you have any questions about your discharge medications or the care you received while you were in the hospital after you are discharged, you can call the unit and asked to speak with the hospitalist on call if the hospitalist that took care of you is not available. Once you are discharged, your primary care physician will handle any further medical issues. Please note that NO REFILLS for any discharge medications will be authorized once you are discharged, as it is imperative that you return to your primary care physician (or establish a relationship with a primary care physician if you do not have one) for your aftercare needs so that  they can reassess your need for medications and monitor your lab values.     Today  Chief Complaint  Patient presents with  . Loss of Consciousness   Pt sob is better , wanna go home with hospice care today  ROS:  CONSTITUTIONAL: Denies fevers, chills. Denies any fatigue, weakness.  EYES: Denies blurry vision, double vision, eye pain. EARS, NOSE, THROAT: Denies tinnitus, ear pain, hearing loss. RESPIRATORY: Denies cough, wheeze, shortness of breath while resting.  CARDIOVASCULAR: Denies chest pain, palpitations, edema.  GASTROINTESTINAL: Denies nausea, vomiting, diarrhea, abdominal pain. Denies bright red blood per rectum. GENITOURINARY: Denies dysuria, hematuria. ENDOCRINE: Denies nocturia or thyroid problems. HEMATOLOGIC AND LYMPHATIC: Denies easy bruising or bleeding. SKIN: Denies rash or lesion. MUSCULOSKELETAL: Denies pain in neck, back, shoulder, knees, hips or arthritic symptoms.  NEUROLOGIC: Denies paralysis, paresthesias.  PSYCHIATRIC: Denies anxiety or depressive symptoms.   VITAL SIGNS:  Blood pressure (!) 136/52, pulse 75, temperature 98.1 F (36.7 C), temperature source Oral, resp. rate 20, height 5\' 2"  (1.575  m), weight 69.3 kg (152 lb 11.2 oz), SpO2 92 %.  I/O:    Intake/Output Summary (Last 24 hours) at 11/23/16 1214 Last data filed at 11/23/16 0956  Gross per 24 hour  Intake              360 ml  Output                0 ml  Net              360 ml    PHYSICAL EXAMINATION:  GENERAL:  56 y.o.-year-old patient lying in the bed with no acute distress.  EYES: Pupils equal, round, reactive to light and accommodation. No scleral icterus. Extraocular muscles intact.  HEENT: Head atraumatic, normocephalic. Oropharynx and nasopharynx clear.  NECK:  Supple, no jugular venous distention. No thyroid enlargement, no tenderness.  LUNGS: Mod breath sounds bilaterally, no wheezing, rales,rhonchi or crepitation. No use of accessory muscles of respiration.  CARDIOVASCULAR: S1, S2 normal. No murmurs, rubs, or gallops.  ABDOMEN: Soft, non-tender, non-distended. Bowel sounds present. No organomegaly or mass.  EXTREMITIES: No pedal edema, cyanosis, or clubbing.  NEUROLOGIC: Cranial nerves II through XII are intact. Muscle strength at base line  in all extremities. Sensation intact. Gait not checked.  PSYCHIATRIC: The patient is alert and oriented x 3.  SKIN: No obvious rash, lesion, or ulcer.   DATA REVIEW:   CBC  Recent Labs Lab 11/18/16 0534  WBC 6.8  HGB 9.5*  HCT 29.3*  PLT 142*    Chemistries   Recent Labs Lab 11/17/16 1418 11/18/16 0534  NA 133* 136  K 3.9 3.6  CL 80* 95*  CO2 44* 32  GLUCOSE 157* 155*  BUN 15 18  CREATININE 0.47 0.73  CALCIUM 9.0 8.1*  AST 16  --   ALT 7*  --   ALKPHOS 54  --   BILITOT 0.7  --     Cardiac Enzymes  Recent Labs Lab 11/17/16 1418  TROPONINI <0.03    Microbiology Results  Results for orders placed or performed during the hospital encounter of 11/17/16  Culture, blood (routine x 2)     Status: None   Collection Time: 11/17/16  2:18 PM  Result Value Ref Range Status   Specimen Description BLOOD BLOOD RIGHT WRIST  Final   Special  Requests   Final    BOTTLES DRAWN AEROBIC AND ANAEROBIC Blood Culture results may not be optimal due to an excessive volume  of blood received in culture bottles   Culture NO GROWTH 5 DAYS  Final   Report Status 11/22/2016 FINAL  Final  Culture, blood (routine x 2)     Status: Abnormal   Collection Time: 11/17/16  2:18 PM  Result Value Ref Range Status   Specimen Description BLOOD RIGHT ANTECUBITAL  Final   Special Requests   Final    BOTTLES DRAWN AEROBIC AND ANAEROBIC Blood Culture adequate volume   Culture  Setup Time   Final    GRAM POSITIVE COCCI AEROBIC BOTTLE ONLY CRITICAL RESULT CALLED TO, READ BACK BY AND VERIFIED WITH: MATT MCBANE AT [place] ON 11/19/16 [place].    Culture (A)  Final    STAPHYLOCOCCUS SPECIES (COAGULASE NEGATIVE) THE SIGNIFICANCE OF ISOLATING THIS ORGANISM FROM A SINGLE SET OF BLOOD CULTURES WHEN MULTIPLE SETS ARE DRAWN IS UNCERTAIN. PLEASE NOTIFY THE MICROBIOLOGY DEPARTMENT WITHIN ONE WEEK IF SPECIATION AND SENSITIVITIES ARE REQUIRED. Performed at [facility], [place] [address], [place], [place] [address]    Report Status 11/20/2016 FINAL  Final  Urine culture     Status: None   Collection Time: 11/17/16  2:18 PM  Result Value Ref Range Status   Specimen Description URINE, RANDOM  Final   Special Requests NONE  Final   Culture   Final    NO GROWTH Performed at [facility], [place] [address], [place], [place] [address]    Report Status 11/19/2016 FINAL  Final  Blood Culture ID Panel (Reflexed)     Status: Abnormal   Collection Time: 11/17/16  2:18 PM  Result Value Ref Range Status   Enterococcus species NOT DETECTED NOT DETECTED Final   Listeria monocytogenes NOT DETECTED NOT DETECTED Final   Staphylococcus species DETECTED (A) NOT DETECTED Final    Comment: Methicillin (oxacillin) resistant coagulase negative staphylococcus. Possible blood culture contaminant (unless isolated from more than one blood culture draw or clinical case suggests  pathogenicity). No antibiotic treatment is indicated for blood  culture contaminants. CRITICAL RESULT CALLED TO, READ BACK BY AND VERIFIED WITH: MATT MCBANE AT 0720 ON 11/19/16 [place].    Staphylococcus aureus NOT DETECTED NOT DETECTED Final   Methicillin resistance DETECTED (A) NOT DETECTED Final    Comment: CRITICAL RESULT CALLED TO, READ BACK BY AND VERIFIED WITH: MATT MCBANE AT 0720 ON 11/19/16 [place].    Streptococcus species NOT DETECTED NOT DETECTED Final   Streptococcus agalactiae NOT DETECTED NOT DETECTED Final   Streptococcus pneumoniae NOT DETECTED NOT DETECTED Final   Streptococcus pyogenes NOT DETECTED NOT DETECTED Final   Acinetobacter baumannii NOT DETECTED NOT DETECTED Final   Enterobacteriaceae species NOT DETECTED NOT DETECTED Final   Enterobacter cloacae complex NOT DETECTED NOT DETECTED Final   Escherichia coli NOT DETECTED NOT DETECTED Final   Klebsiella oxytoca NOT DETECTED NOT DETECTED Final   Klebsiella pneumoniae NOT DETECTED NOT DETECTED Final   Proteus species NOT DETECTED NOT DETECTED Final   Serratia marcescens NOT DETECTED NOT DETECTED Final   Haemophilus influenzae NOT DETECTED NOT DETECTED Final   Neisseria meningitidis NOT DETECTED NOT DETECTED Final   Pseudomonas aeruginosa NOT DETECTED NOT DETECTED Final   Candida albicans NOT DETECTED NOT DETECTED Final   Candida glabrata NOT DETECTED NOT DETECTED Final   Candida krusei NOT DETECTED NOT DETECTED Final   Candida parapsilosis NOT DETECTED NOT DETECTED Final   Candida tropicalis NOT DETECTED NOT DETECTED Final  MRSA PCR Screening     Status: None   Collection Time: 11/17/16  6:11 PM  Result Value Ref Range Status   MRSA by PCR NEGATIVE NEGATIVE Final    Comment:        The GeneXpert MRSA Assay (FDA approved for NASAL specimens only), is one component of a comprehensive MRSA colonization surveillance program. It is not intended to diagnose MRSA infection nor to guide or monitor treatment for MRSA  infections.     RADIOLOGY:  No results found.  EKG:   Orders placed or performed during the hospital encounter of 11/17/16  . EKG 12-Lead  . EKG 12-Lead      Management plans discussed with the patient, family and they are in agreement.  CODE STATUS:     Code Status Orders        Start     Ordered   11/17/16 1707  Do not attempt resuscitation (DNR)  Continuous    Question Answer Comment  In the event of cardiac or respiratory ARREST Do not call a "code blue"   In the event of cardiac or respiratory ARREST Do not perform Intubation, CPR, defibrillation or ACLS   In the event of cardiac or respiratory ARREST Use medication by any route, position, wound care, and other measures to relive pain and suffering. May use oxygen, suction and manual treatment of airway obstruction as needed for comfort.      11/17/16 1710    Code Status History    Date Active Date Inactive Code Status Order ID Comments User Context   08/14/2016  5:01 PM 11/17/2016  2:07 PM DNR [id number] Confirmed DNR.  Patient and husband verbalize understanding to withhold intubation, CPR, defibrillation, and ACLS protocol. Mikey College, NP Outpatient   03/06/2016  1:59 PM 03/08/2016  2:31 PM DNR [id number]  Basilio Cairo, NP Inpatient   03/05/2016  1:36 AM 03/06/2016  1:59 PM Full Code [id number]  Hugelmeyer, Ubaldo Glassing, DO Inpatient    Advance Directive Documentation     Most Recent Value  Type of Advance Directive  Out of facility DNR (pink MOST or yellow form)  Pre-existing out of facility DNR order (yellow form or pink MOST form)  Yellow form placed in chart (order not valid for inpatient use)  "MOST" Form in Place?  -      TOTAL TIME TAKING CARE OF THIS PATIENT: 42 minutes.   Note: This dictation was prepared with Dragon dictation along with smaller phrase technology. Any transcriptional errors that result from this process are unintentional.   @[facility] @  on 11/23/2016 at 12:14 PM  Between 7am to 6pm -  Pager - [phone]  After 6pm go to [email or web address] - password [email or web address] [facility]  Office  [phone]  CC: Primary care physician; Allyne Gee, MD

## 2016-11-23 NOTE — Progress Notes (Signed)
Pt up to the bsc. Sob with exertion, O2 sats 83% on 1.5L O2 per [place]. O2 increased to 3L. O2 sats up to 95% in couple of minutes.

## 2016-11-23 NOTE — Progress Notes (Signed)
Pt is being discharged home. Discharge papers given and explained to pt and spouse, both verbalized understanding. Meds reviewed with pt. RX given to spouse. Awaiting EMS.

## 2016-11-23 NOTE — Care Management (Signed)
Notified by Flo Shanks with [facility] that patient to discharge home today with home hospice services.  EMS transport sheet completed and placed on chart.  RNCM signing off.

## 2016-11-23 NOTE — Progress Notes (Signed)
Follow up visit made to new referral for [facility] services at home. Writer has spoken to Mr. Patty Coleman and to the patient. Plan is for discharge home today via EMS. Hospital care team and hospice referral all aware. Signed DNR  And prescriptions in place in patient's discharge packet. Thank you. Flo Shanks RN, BSN, [facility] [place], hospital Liaison [phone]

## 2017-03-18 ENCOUNTER — Ambulatory Visit (INDEPENDENT_AMBULATORY_CARE_PROVIDER_SITE_OTHER): Payer: Medicaid Other | Admitting: Nurse Practitioner

## 2017-03-18 ENCOUNTER — Encounter: Payer: Self-pay | Admitting: Nurse Practitioner

## 2017-03-18 VITALS — BP 136/58 | HR 92 | Temp 98.3°F | Resp 16 | Ht 62.0 in | Wt 143.8 lb

## 2017-03-18 DIAGNOSIS — J431 Panlobular emphysema: Secondary | ICD-10-CM

## 2017-03-18 DIAGNOSIS — M545 Low back pain: Secondary | ICD-10-CM

## 2017-03-18 DIAGNOSIS — G8929 Other chronic pain: Secondary | ICD-10-CM

## 2017-03-18 DIAGNOSIS — R0602 Shortness of breath: Secondary | ICD-10-CM

## 2017-03-18 DIAGNOSIS — G4701 Insomnia due to medical condition: Secondary | ICD-10-CM | POA: Diagnosis not present

## 2017-03-18 DIAGNOSIS — F419 Anxiety disorder, unspecified: Secondary | ICD-10-CM

## 2017-03-18 MED ORDER — ALPRAZOLAM 0.5 MG PO TABS: 0.5000 mg | ORAL_TABLET | Freq: Two times a day (BID) | ORAL | 2 refills | Status: DC | PRN

## 2017-03-18 MED ORDER — ZOLPIDEM TARTRATE ER 6.25 MG PO TBCR: 6.2500 mg | EXTENDED_RELEASE_TABLET | Freq: Every evening | ORAL | 2 refills | Status: DC | PRN

## 2017-03-18 MED ORDER — TRAMADOL HCL 50 MG PO TABS: 50.0000 mg | ORAL_TABLET | Freq: Two times a day (BID) | ORAL | 3 refills | Status: DC | PRN

## 2017-03-18 NOTE — Progress Notes (Signed)
Subjective:    Patient ID: Patty Coleman, female    DOB: [birth year], 56 y.o.   MRN: [id number]  Patty Coleman is a 56 y.o. female presenting on 03/18/2017 for Anxiety (follow up after patient is released from hospice)   HPI Anxiety Pt has had improvement of symptoms related to COPD with management of Anxiety during hospice care.  Pt desires to have continuation of several medications used during hospice including ambien, alprazolam, and tramadol. - Gets very nervous inside, wants to cry and scream.  Alprazolam works after about 25 minutes.  Breathing trouble makes this sensation worse and anxiety makes breathing worse.  Pt reports good control of both breathing and anxiety with taking alprazolam.  Insomnia: Pt was taking Ambien 5 mg w/ hospice.  Does help pt fall asleep, but does not help stay asleep past 2.5-3 hours.  Pt was previously unable to sleep, so this is an improvement and pt desires to continue medication. - Reports no adverse effects and has not experienced sleepwalking or agitation. - Pt has taken trazodone in past w/ significant daytime sleepiness.  Chronic Pain Pt reports moderate pain in lower back, hips and knees.  Has been taking tramadol for about 3 weeks since being in hospice care.  Pt states is not a new medication as she first used around 2013-2014.  Between that 2014 and now, she used ibuprofen only.  Significantly affects breathing when in more pain r/t being upset about the pain.  Pt has had significant improvement of symptoms w/ improved pain control and desires to continue tramadol twice daily prn moderate pain.  Cigarette Smoker Pt has resumed smoking over last 2 weeks.  She had previously quit.  Pt attributes this to being out of alprazolam.  Self Care deficit: Very short of breath even w/ sponge bath.  Pt and husband state they need help with bathing.  This part of hospice care is desperately missed and they request assistance for obtaining a  home health aide for bathing.   Touched by [facility] and [facility] were both discussed by Pt's husband.  He has no other specific details about needs from provider to obtain these services.  Social History   Tobacco Use  . Smoking status: Current Every Day Smoker    Packs/day: 1.25    Years: 35.00    Pack years: 43.75    Types: Cigarettes  . Smokeless tobacco: Current User  . Tobacco comment: would like to quit  Substance Use Topics  . Alcohol use: No  . Drug use: No    Review of Systems Per HPI unless specifically indicated above     Objective:    BP (!) 136/58   Pulse 92   Temp 98.3 F (36.8 C) (Oral)   Resp 16   Ht 5\' 2"  (1.575 m)   Wt 143 lb 12.8 oz (65.2 kg)   SpO2 99% Comment: on 2.5 liter of oxygen  BMI 26.30 kg/m   Wt Readings from Last 3 Encounters:  03/18/17 143 lb 12.8 oz (65.2 kg)  11/23/16 152 lb 11.2 oz (69.3 kg)  08/14/16 148 lb 3.2 oz (67.2 kg)    Physical Exam  General - overweight, well-appearing, NAD HEENT - Normocephalic, atraumatic Neck - supple, non-tender, no LAD Heart - RRR, no murmurs heard Lungs - Decreased air movement throughout all lobes, no wheezing, crackles, or rhonchi. Minimal use of accessory muscles.  Pt wears O2 today. Extremeties - non-tender, trace pedal edema, cap refill < 2 seconds,  peripheral pulses intact +2 bilaterally Skin - warm, dry Neuro - awake, alert, oriented x3, pt very weak 3/5 all extremities,  Uses wheelchair for mobility today. Psych - Normal mood and affect, normal behavior      Assessment & Plan:   Problem List Items Addressed This Visit      Other   Anxiety    Active and ongoing problem relieved w/ alprazolam 0.5 mg bid prn.  Pt has tried and failed buspirone at last visit and declines to use alternative medication.  Pt notes smoking cessation was possible when resuming alprazolam. Pt was previously lost to followup.  Plan: 1. Manage anxiety for management of COPD and respiratory stability.   2.  Resume and continue alprazolam 0.5 mg bid prn anxiety. 3. Follow up 3 months or sooner if needed.  Discussed there will be no refills on alprazolam unless followup is maintained. 4. Controlled substance contract reviewed with patient and signed.       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  Pt w/o side effects to [employer].  Plan: 1. Discussed Ambien CR.  Pt desires to try this instead of Ambien. - START Ambien CR 6.25 mg tablet.  Take one at bedtime. 2. Encouraged good sleep hygiene. 3. Followup 6 months or sooner if CR tablet not effective.      Relevant Medications   zolpidem (AMBIEN CR) 6.25 MG CR tablet   Chronic bilateral low back pain without sciatica    Pt reports chronic pain of low back, hips, knees.  Declines presence of sciatica.  Pt has had tramadol Rx since hospice care which has improved overall pain and respiratory status.  Plan: 1. Continue tramadol 50 mg tablet twice daily prn moderate pain. 2. Discussed need to maintain regular followup in clinic. 3. Followup 6 months.      Relevant Medications   traMADol (ULTRAM) 50 MG tablet      Meds ordered this encounter  Medications  . zolpidem (AMBIEN CR) 6.25 MG CR tablet    Sig: Take 1 tablet (6.25 mg total) by mouth at bedtime as needed for sleep.    Dispense:  30 tablet    Refill:  2    Order Specific Question:   Supervising Provider    Answer:   Olin Hauser [[id number]]  . ALPRAZolam (XANAX) 0.5 MG tablet    Sig: Take 1 tablet (0.5 mg total) by mouth 2 (two) times daily as needed for anxiety.    Dispense:  60 tablet    Refill:  2    Order Specific Question:   Supervising Provider    Answer:   Olin Hauser [[id number]]  . traMADol (ULTRAM) 50 MG tablet    Sig: Take 1 tablet (50 mg total) by mouth every 12 (twelve) hours as needed for  moderate pain.    Dispense:  60 tablet    Refill:  3    Order Specific Question:   Supervising Provider    Answer:   Joellyn Rued   PMP Aware reviewed for controlled substances prescribed by other providers for last 2 years.  History matches patient report.   Follow up plan: Return in about 3 months (around 06/16/2017) for COPD, pain, anxiety.   Cassell Smiles, DNP, AGPCNP-BC Adult Gerontology Primary Care Nurse Practitioner [facility] 03/23/2017, 8:33 AM

## 2017-03-18 NOTE — Patient Instructions (Addendum)
Patty Coleman, Thank you for coming in to clinic today.  1. Continue all medications as previously prescribed.  Controlled substances will be required for your palliation of end stage COPD.  2. Ambien is changed to continuous release to assist with sustaining sleep.  Please schedule a follow-up appointment with Patty Coleman, AGNP. Return in about 3 months (around 06/16/2017) for COPD, pain, anxiety.  If you have any other questions or concerns, please feel free to call the clinic or send a message through [facility]. You may also schedule an earlier appointment if necessary.  You will receive a survey after today's visit either digitally by e-mail or paper by [employer]. Your experiences and feedback matter to [place].  Please respond so we know how we are doing as we provide care for you.   Patty Smiles, DNP, AGNP-BC Adult Gerontology Nurse Practitioner [place]

## 2017-03-19 ENCOUNTER — Other Ambulatory Visit: Payer: Self-pay

## 2017-03-23 ENCOUNTER — Encounter: Payer: Self-pay | Admitting: Nurse Practitioner

## 2017-03-23 DIAGNOSIS — M545 Low back pain: Secondary | ICD-10-CM

## 2017-03-23 DIAGNOSIS — G8929 Other chronic pain: Secondary | ICD-10-CM | POA: Insufficient documentation

## 2017-03-23 DIAGNOSIS — G4701 Insomnia due to medical condition: Secondary | ICD-10-CM | POA: Insufficient documentation

## 2017-03-23 NOTE — Addendum Note (Signed)
Addended by: Cassell Smiles R on: 03/23/2017 11:17 AM   Modules accepted: Orders

## 2017-03-23 NOTE — Assessment & Plan Note (Signed)
Pt reports chronic pain of low back, hips, knees.  Declines presence of sciatica.  Pt has had tramadol Rx since hospice care which has improved overall pain and respiratory status.  Plan: 1. Continue tramadol 50 mg tablet twice daily prn moderate pain. 2. Discussed need to maintain regular followup in clinic. 3. Followup 6 months.

## 2017-03-23 NOTE — Assessment & Plan Note (Addendum)
Active and ongoing problem relieved w/ alprazolam 0.5 mg bid prn.  Pt has tried and failed buspirone at last visit and declines to use alternative medication.  Pt notes smoking cessation was possible when resuming alprazolam. Pt was previously lost to followup.  Plan: 1. Manage anxiety for management of COPD and respiratory stability.   2. Resume and continue alprazolam 0.5 mg bid prn anxiety. 3. Follow up 3 months or sooner if needed.  Discussed there will be no refills on alprazolam unless followup is maintained. 4. Controlled substance contract reviewed with patient and signed.

## 2017-03-23 NOTE — Assessment & Plan Note (Signed)
Pt notes some improvement w/ Ambien, but still has difficulty w/ sleep maintenance after onset.  Pt w/o side effects to [employer].  Plan: 1. Discussed Ambien CR.  Pt desires to try this instead of Ambien. - START Ambien CR 6.25 mg tablet.  Take one at bedtime. 2. Encouraged good sleep hygiene. 3. Followup 6 months or sooner if CR tablet not effective.

## 2017-04-16 ENCOUNTER — Other Ambulatory Visit: Payer: Self-pay | Admitting: Nurse Practitioner

## 2017-04-16 MED ORDER — ZOLPIDEM TARTRATE 5 MG PO TABS: 5.0000 mg | ORAL_TABLET | Freq: Every evening | ORAL | 5 refills | Status: DC | PRN

## 2017-04-16 NOTE — Progress Notes (Signed)
Pt has not had approval of ambien CR r/t insurance coverage.  Is still using ambien despite not having action > 3 hours for sleep.  Requests 30 day supply while we appeal for coverage.

## 2017-04-21 ENCOUNTER — Other Ambulatory Visit: Payer: Self-pay

## 2017-05-03 ENCOUNTER — Emergency Department: Payer: Medicaid Other

## 2017-05-03 ENCOUNTER — Encounter: Payer: Self-pay | Admitting: Emergency Medicine

## 2017-05-03 ENCOUNTER — Other Ambulatory Visit: Payer: Self-pay

## 2017-05-03 ENCOUNTER — Inpatient Hospital Stay
Admission: EM | Admit: 2017-05-03 | Discharge: 2017-05-07 | DRG: 190 | Disposition: A | Discharge status: Hospice | Payer: Medicaid Other | Attending: Family Medicine | Admitting: Family Medicine

## 2017-05-03 DIAGNOSIS — J9602 Acute respiratory failure with hypercapnia: Secondary | ICD-10-CM | POA: Diagnosis not present

## 2017-05-03 DIAGNOSIS — Z882 Allergy status to sulfonamides status: Secondary | ICD-10-CM | POA: Diagnosis not present

## 2017-05-03 DIAGNOSIS — Z91012 Allergy to eggs: Secondary | ICD-10-CM | POA: Diagnosis not present

## 2017-05-03 DIAGNOSIS — Z9981 Dependence on supplemental oxygen: Secondary | ICD-10-CM | POA: Diagnosis not present

## 2017-05-03 DIAGNOSIS — R778 Other specified abnormalities of plasma proteins: Secondary | ICD-10-CM

## 2017-05-03 DIAGNOSIS — Z7951 Long term (current) use of inhaled steroids: Secondary | ICD-10-CM

## 2017-05-03 DIAGNOSIS — J9622 Acute and chronic respiratory failure with hypercapnia: Secondary | ICD-10-CM | POA: Diagnosis present

## 2017-05-03 DIAGNOSIS — J441 Chronic obstructive pulmonary disease with (acute) exacerbation: Secondary | ICD-10-CM | POA: Diagnosis present

## 2017-05-03 DIAGNOSIS — G9341 Metabolic encephalopathy: Secondary | ICD-10-CM | POA: Diagnosis present

## 2017-05-03 DIAGNOSIS — R4020 Unspecified coma: Secondary | ICD-10-CM | POA: Diagnosis present

## 2017-05-03 DIAGNOSIS — F411 Generalized anxiety disorder: Secondary | ICD-10-CM | POA: Diagnosis not present

## 2017-05-03 DIAGNOSIS — J9600 Acute respiratory failure, unspecified whether with hypoxia or hypercapnia: Secondary | ICD-10-CM

## 2017-05-03 DIAGNOSIS — Z66 Do not resuscitate: Secondary | ICD-10-CM | POA: Diagnosis present

## 2017-05-03 DIAGNOSIS — R402433 Glasgow coma scale score 3-8, at hospital admission: Secondary | ICD-10-CM | POA: Diagnosis present

## 2017-05-03 DIAGNOSIS — E874 Mixed disorder of acid-base balance: Secondary | ICD-10-CM | POA: Diagnosis present

## 2017-05-03 DIAGNOSIS — F17210 Nicotine dependence, cigarettes, uncomplicated: Secondary | ICD-10-CM | POA: Diagnosis present

## 2017-05-03 DIAGNOSIS — R402414 Glasgow coma scale score 13-15, 24 hours or more after hospital admission: Secondary | ICD-10-CM | POA: Diagnosis not present

## 2017-05-03 DIAGNOSIS — Z79899 Other long term (current) drug therapy: Secondary | ICD-10-CM

## 2017-05-03 DIAGNOSIS — Z881 Allergy status to other antibiotic agents status: Secondary | ICD-10-CM | POA: Diagnosis not present

## 2017-05-03 DIAGNOSIS — Z515 Encounter for palliative care: Secondary | ICD-10-CM | POA: Diagnosis present

## 2017-05-03 DIAGNOSIS — R402424 Glasgow coma scale score 9-12, 24 hours or more after hospital admission: Secondary | ICD-10-CM | POA: Diagnosis not present

## 2017-05-03 DIAGNOSIS — R748 Abnormal levels of other serum enzymes: Secondary | ICD-10-CM | POA: Diagnosis present

## 2017-05-03 DIAGNOSIS — J9621 Acute and chronic respiratory failure with hypoxia: Secondary | ICD-10-CM | POA: Diagnosis present

## 2017-05-03 DIAGNOSIS — R0600 Dyspnea, unspecified: Secondary | ICD-10-CM | POA: Diagnosis not present

## 2017-05-03 DIAGNOSIS — J9601 Acute respiratory failure with hypoxia: Secondary | ICD-10-CM

## 2017-05-03 DIAGNOSIS — R7989 Other specified abnormal findings of blood chemistry: Secondary | ICD-10-CM

## 2017-05-03 DIAGNOSIS — G9340 Encephalopathy, unspecified: Secondary | ICD-10-CM

## 2017-05-03 DIAGNOSIS — Z7189 Other specified counseling: Secondary | ICD-10-CM

## 2017-05-03 LAB — CBC WITH DIFFERENTIAL/PLATELET
BASOS ABS: 0 10*3/uL (ref 0–0.1)
Basophils Relative: 0 %
EOS ABS: 0 10*3/uL (ref 0–0.7)
Eosinophils Relative: 0 %
HEMATOCRIT: 40.6 % (ref 35.0–47.0)
Hemoglobin: 11.8 g/dL — ABNORMAL LOW (ref 12.0–16.0)
LYMPHS ABS: 0.6 10*3/uL — AB (ref 1.0–3.6)
Lymphocytes Relative: 7 %
MCH: 26.9 pg (ref 26.0–34.0)
MCHC: 29.1 g/dL — AB (ref 32.0–36.0)
MCV: 92.4 fL (ref 80.0–100.0)
MONO ABS: 0.5 10*3/uL (ref 0.2–0.9)
Monocytes Relative: 6 %
NEUTROS ABS: 6.8 10*3/uL — AB (ref 1.4–6.5)
Neutrophils Relative %: 87 %
Platelets: 162 10*3/uL (ref 150–440)
RBC: 4.4 MIL/uL (ref 3.80–5.20)
RDW: 14.7 % — AB (ref 11.5–14.5)
WBC: 7.9 10*3/uL (ref 3.6–11.0)

## 2017-05-03 LAB — BLOOD GAS, ARTERIAL
DELIVERY SYSTEMS: POSITIVE
PATIENT TEMPERATURE: 37
PH ART: 7.07 — AB (ref 7.350–7.450)
pCO2 arterial: 120 mmHg (ref 32.0–48.0)
pO2, Arterial: 239 mmHg — ABNORMAL HIGH (ref 83.0–108.0)

## 2017-05-03 LAB — LACTIC ACID, PLASMA
LACTIC ACID, VENOUS: 1.9 mmol/L (ref 0.5–1.9)
Lactic Acid, Venous: 0.8 mmol/L (ref 0.5–1.9)

## 2017-05-03 LAB — TROPONIN I
TROPONIN I: 0.11 ng/mL — AB (ref <0.03)
TROPONIN I: 0.26 ng/mL — AB (ref <0.03)
Troponin I: 0.23 ng/mL (ref <0.03)

## 2017-05-03 LAB — COMPREHENSIVE METABOLIC PANEL
ALT: 10 U/L — AB (ref 14–54)
AST: 27 U/L (ref 15–41)
Albumin: 3.5 g/dL (ref 3.5–5.0)
Alkaline Phosphatase: 64 U/L (ref 38–126)
Anion gap: 12 (ref 5–15)
BUN: 29 mg/dL — AB (ref 6–20)
CALCIUM: 9.2 mg/dL (ref 8.9–10.3)
CHLORIDE: 81 mmol/L — AB (ref 101–111)
CO2: 48 mmol/L — ABNORMAL HIGH (ref 22–32)
CREATININE: 0.62 mg/dL (ref 0.44–1.00)
Glucose, Bld: 118 mg/dL — ABNORMAL HIGH (ref 65–99)
Potassium: 4.4 mmol/L (ref 3.5–5.1)
Sodium: 141 mmol/L (ref 135–145)
Total Bilirubin: 0.8 mg/dL (ref 0.3–1.2)
Total Protein: 6.9 g/dL (ref 6.5–8.1)

## 2017-05-03 LAB — GLUCOSE, CAPILLARY: Glucose-Capillary: 146 mg/dL — ABNORMAL HIGH (ref 65–99)

## 2017-05-03 LAB — CK: CK TOTAL: 77 U/L (ref 38–234)

## 2017-05-03 LAB — PROCALCITONIN: Procalcitonin: <0.1 ng/mL

## 2017-05-03 LAB — MRSA PCR SCREENING: MRSA BY PCR: NEGATIVE

## 2017-05-03 MED ORDER — CEFTRIAXONE SODIUM IN DEXTROSE 20 MG/ML IV SOLN
1.0000 g | Freq: Once | INTRAVENOUS | Status: AC
  Administered 2017-05-03: 1 g via INTRAVENOUS
  Filled 2017-05-03: qty 50

## 2017-05-03 MED ORDER — IPRATROPIUM-ALBUTEROL 0.5-2.5 (3) MG/3ML IN SOLN
3.0000 mL | Freq: Once | RESPIRATORY_TRACT | Status: AC
  Administered 2017-05-03: 3 mL via RESPIRATORY_TRACT
  Filled 2017-05-03: qty 3

## 2017-05-03 MED ORDER — ONDANSETRON HCL 4 MG/2ML IJ SOLN: 4.0000 mg | Freq: Four times a day (QID) | INTRAMUSCULAR | Status: DC | PRN

## 2017-05-03 MED ORDER — ORAL CARE MOUTH RINSE
15.0000 mL | Freq: Two times a day (BID) | OROMUCOSAL | Status: DC
  Administered 2017-05-03 – 2017-05-06 (×6): 15 mL via OROMUCOSAL

## 2017-05-03 MED ORDER — ACETAMINOPHEN 650 MG RE SUPP: 650.0000 mg | Freq: Four times a day (QID) | RECTAL | Status: DC | PRN

## 2017-05-03 MED ORDER — BUDESONIDE 0.5 MG/2ML IN SUSP
0.5000 mg | Freq: Two times a day (BID) | RESPIRATORY_TRACT | Status: DC
  Administered 2017-05-03 – 2017-05-07 (×8): 0.5 mg via RESPIRATORY_TRACT
  Filled 2017-05-03 (×8): qty 2

## 2017-05-03 MED ORDER — SODIUM CHLORIDE 0.9% FLUSH
3.0000 mL | Freq: Two times a day (BID) | INTRAVENOUS | Status: DC
  Administered 2017-05-03 – 2017-05-07 (×8): 3 mL via INTRAVENOUS

## 2017-05-03 MED ORDER — ONDANSETRON HCL 4 MG PO TABS: 4.0000 mg | ORAL_TABLET | Freq: Four times a day (QID) | ORAL | Status: DC | PRN

## 2017-05-03 MED ORDER — HEPARIN SODIUM (PORCINE) 5000 UNIT/ML IJ SOLN
5000.0000 [IU] | Freq: Three times a day (TID) | INTRAMUSCULAR | Status: DC
  Administered 2017-05-03 – 2017-05-07 (×11): 5000 [IU] via SUBCUTANEOUS
  Filled 2017-05-03 (×10): qty 1

## 2017-05-03 MED ORDER — IPRATROPIUM-ALBUTEROL 0.5-2.5 (3) MG/3ML IN SOLN
3.0000 mL | RESPIRATORY_TRACT | Status: DC
  Administered 2017-05-03 – 2017-05-07 (×23): 3 mL via RESPIRATORY_TRACT
  Filled 2017-05-03 (×23): qty 3

## 2017-05-03 MED ORDER — ACETAMINOPHEN 325 MG PO TABS: 650.0000 mg | ORAL_TABLET | Freq: Four times a day (QID) | ORAL | Status: DC | PRN

## 2017-05-03 MED ORDER — DEXTROSE 5 % IV SOLN
500.0000 mg | Freq: Once | INTRAVENOUS | Status: AC
  Administered 2017-05-03: 500 mg via INTRAVENOUS
  Filled 2017-05-03: qty 500

## 2017-05-03 MED ORDER — METHYLPREDNISOLONE SODIUM SUCC 125 MG IJ SOLR
60.0000 mg | Freq: Four times a day (QID) | INTRAMUSCULAR | Status: DC
  Administered 2017-05-03 – 2017-05-04 (×4): 60 mg via INTRAVENOUS
  Filled 2017-05-03 (×4): qty 2

## 2017-05-03 MED ORDER — NICOTINE 14 MG/24HR TD PT24
14.0000 mg | MEDICATED_PATCH | Freq: Every day | TRANSDERMAL | Status: DC
Start: 2017-05-03 — End: 2017-05-07
  Administered 2017-05-03 – 2017-05-07 (×5): 14 mg via TRANSDERMAL
  Filled 2017-05-03 (×5): qty 1

## 2017-05-03 MED ORDER — BISACODYL 10 MG RE SUPP: 10.0000 mg | Freq: Every day | RECTAL | Status: DC | PRN

## 2017-05-03 MED ORDER — CHLORHEXIDINE GLUCONATE 0.12 % MT SOLN
15.0000 mL | Freq: Two times a day (BID) | OROMUCOSAL | Status: DC
  Administered 2017-05-03 – 2017-05-07 (×6): 15 mL via OROMUCOSAL
  Filled 2017-05-03 (×6): qty 15

## 2017-05-03 MED ORDER — METHYLPREDNISOLONE SODIUM SUCC 125 MG IJ SOLR
125.0000 mg | Freq: Once | INTRAMUSCULAR | Status: AC
  Administered 2017-05-03: 125 mg via INTRAVENOUS
  Filled 2017-05-03: qty 2

## 2017-05-03 NOTE — ED Notes (Signed)
RT at bedside.

## 2017-05-03 NOTE — ED Notes (Signed)
Elevated troponin of 0.11 reported to Dr Benn Moulder bark - no new orders at this time

## 2017-05-03 NOTE — ED Notes (Signed)
Pt turned, dried, repositioned

## 2017-05-03 NOTE — ED Notes (Signed)
Pt's DNR placed in chart

## 2017-05-03 NOTE — Progress Notes (Signed)
PULMONARY / CRITICAL CARE MEDICINE   Name: Patty Coleman MRN: [id number] DOB: 1960/05/09    ADMISSION DATE:  05/03/2017 CONSULTATION DATE:  05/03/2017  REFERRING MD:  Dr. Jerelyn Charles  CHIEF COMPLAINT:  Unresponsive  HISTORY OF PRESENT ILLNESS:   History obtained from chart review. 57 year-old female with PMH as below found unresponsive and dyspneic with O2 saturations in the 40s at home. Pt also hypothermic at 94 degrees. Initial VBG 7.1/120/??/48. Pt made DNR in the ED and started on bipap. CXR with right hilar prominence, negative PCT, troponin 0.11, EKG with non-specific ST-segment depressions and RAE. Admitted to ICU with coma secondary to hypercarbic hypoxic respiratory failure.   Patient with previous ICU admission for severe COPD Patient with end stage COPD   biPAP settings TV 400, RR 15, IPAP 20 EPAP 5 fio2 at 100% Follow up ABG shows severe resp acidosis with pH 6.9/120/70  Patient obtunded +comatosed Unable to respond to vocal or painful stimuli  Husband Chuck at bedside-updated and notified   PAST MEDICAL HISTORY :  She  has a past medical history of Anxiety, Cancer ([place]), COPD (chronic obstructive pulmonary disease) ([place]), Hypercholesteremia, and Urinary incontinence.  PAST SURGICAL HISTORY: She  has a past surgical history that includes Cesarean section and Abdominal hysterectomy.  Allergies  Allergen Reactions  . Sulfa Antibiotics Swelling    angioedema  . Eggs Or Egg-Derived Products Hives and Nausea Only  . Lincocin [Lincomycin Hcl] Hives    No current facility-administered medications on file prior to encounter.    Current Outpatient Medications on File Prior to Encounter  Medication Sig  . budesonide (PULMICORT) 0.5 MG/2ML nebulizer solution VVN BID  . ipratropium-albuterol (DUONEB) 0.5-2.5 (3) MG/3ML SOLN Take 3 mLs by nebulization every 4 (four) hours as needed (shortness of breath).    FAMILY HISTORY:  Her indicated that her mother is  deceased. She indicated that her father is deceased. She indicated that her brother is alive. She indicated that both of her daughters are alive.   SOCIAL HISTORY: She  reports that she has been smoking cigarettes.  She has a 43.75 pack-year smoking history. She uses smokeless tobacco. She reports that she does not drink alcohol or use drugs.  REVIEW OF SYSTEMS:   Not done 2/2 unresponsive.  SUBJECTIVE:  Critically ill-appearing unresponsive female lying in hospital bed on NIV.   VITAL SIGNS: BP 123/63 (BP Location: Right Arm)   Pulse 80   Temp (!) 97.3 F (36.3 C) (Axillary)   Resp 15   Ht '5\' 4"'  (1.626 m)   Wt 62.2 kg (137 lb 2 oz)   SpO2 94%   BMI 23.54 kg/m      VENTILATOR SETTINGS: FiO2 (%):  [100 %] 100 %  PHYSICAL EXAMINATION: General: Critically ill-appearing, biPAP in place Neuro:  Unresponsive, GCS<8 HEENT: NCAT. Pupils constricted and non-reactive.  Cardiovascular: S1, S2, RRR no m/g/r. No LE edema. Pulses in LE non-palpable, +doppler.  Lungs:  Poor respiratory effort. Absent breath sounds  Abdomen:  Soft non-distended Musculoskeletal:  No gross abnormalities Skin:  Mottled skin on lower extremities. Acyanotic, no lesions.  LABS:  BMET Recent Labs  Lab 05/03/17 1100  NA 141  K 4.4  CL 81*  CO2 48*  BUN 29*  CREATININE 0.62  GLUCOSE 118*    Electrolytes Recent Labs  Lab 05/03/17 1100  CALCIUM 9.2    CBC Recent Labs  Lab 05/03/17 1100  WBC 7.9  HGB 11.8*  HCT 40.6  PLT 162  Coag's No results for input(s): APTT, INR in the last 168 hours.  Sepsis Markers Recent Labs  Lab 05/03/17 1100 05/03/17 1101  LATICACIDVEN 1.9  --   PROCALCITON  --  <0.10    ABG Recent Labs  Lab 05/03/17 1500  PHART 6.93*  PCO2ART 120*  PO2ART 70*    Liver Enzymes Recent Labs  Lab 05/03/17 1100  AST 27  ALT 10*  ALKPHOS 64  BILITOT 0.8  ALBUMIN 3.5    Cardiac Enzymes Recent Labs  Lab 05/03/17 1100  TROPONINI 0.11*     Glucose No results for input(s): GLUCAP in the last 168 hours.  Imaging Dg Chest Port 1 View  Result Date: 05/03/2017 CLINICAL DATA:  Shortness of breath. History of COPD, current smoker. EXAM: PORTABLE CHEST 1 VIEW COMPARISON:  Chest x-ray of November 17, 2016 FINDINGS: The lungs are well-expanded. There are chronic changes peripherally in the right mid upper hemithorax. There is prominence of the right hilar structures slightly more conspicuous than in the past. The left lung is well-expanded. There stable left apical pleural thickening. The heart and pulmonary vascularity are normal. IMPRESSION: Mild prominence of the right hilar structures more conspicuous than in the past. Stable pleural based density peripherally in the right mid upper hemithorax and in the left apex. Given the right hilar prominence, chest CT scanning would be useful in an effort to exclude occult malignancy. Electronically Signed   By: David  Martinique M.D.   On: 05/03/2017 10:32   I have Independently reviewed images of  CXR   on 05/03/2017 Interpretation:RT hilar prominance, no acute densiity    STUDIES:  CXR with right hilar prominence   SIGNIFICANT EVENTS: 1/21 Brought to ED via EMS with dyspnea and unresponsiveness. Pt with profound respiratory acidosis. Troponin elevated, EKG with non-specific ST depression. PCT negative. 1/21 Pt made DNR/DNI 1/21 Admitted to ICU on bipap, blood gas shows worsening acidosis with ph 6.9. 1/21 Spoke with pt's husband and daughter about pt's poor prognosis. Family is enroute from [place] and would like to continue NIV until they arrive.    LINES/TUBES: 20g IV in left EJ  DISCUSSION: Pt presents unresponsive with critical respiratory failure and profound respiratory acidosis. Comfort care most appropriate course for this pt at this point.    ASSESSMENT / PLAN: 57 yo WF admitted to to ICU for acute and severe resp failure from severe end stage COPD with progressive metabolic  encephalopathy from severe hypercapnia and severe hypoxia.  Patient is DNR/DNi as confirmed with husband at bedside    PULMONARY A: Acute respiratory failure with hypoxia and hypercarbia P:   Pt is DNR/DNI.  Will continue bipap until family arrives. Continue bronchodilators and steroids.  Follow up ABG pending  CARDIOVASCULAR A:  Elevated troponin in the setting of acute critical illness P:  Unlikely a primary cardiac etiology given history. With a very poor prognosis, will not continue to follow troponin values.   RENAL A:   Acute respiratory acidosis on chronic respiratory acidosis, worsening on bipap P:   DNR/DNI. Will not intubate.   GASTROINTESTINAL A:   No problems P:   None Keep NPO  HEMATOLOGIC A:   No problems P:   DVT prophylaxis   INFECTIOUS A:   No problems P:   none  ENDOCRINE A:   No problems   P:   none  NEUROLOGIC A:   Acute encephalopathy in the setting of significant hypercarbia/hypoxia P:   Ph and PCO2 worsening on NIV.  FAMILY  - Updates: Met with husband and explained that prognosis is very poor even with NIV. Pt has daughters coming in from [place], would like to continue NIV at least until their arrival.     Critical Care Time devoted to patient care services described in this note is 45 minutes.   Overall, patient is critically ill, prognosis is guarded.  Patient with Multiorgan failure and at high risk for cardiac arrest and death.  Plan for Comfort care measures in next 24 hrs if family arrives   Corrin Parker, M.D.  [facility]  Medical Director [place] Director [facility] Cardio-Pulmonary Department

## 2017-05-03 NOTE — ED Provider Notes (Signed)
[facility] Emergency Department Provider Note  ____________________________________________   First MD Initiated Contact with Patient 05/03/17 1007     (approximate)  I have reviewed the triage vital signs and the nursing notes.   HISTORY  Chief Complaint Shortness of Breath  Level 5 exemption history limited by the patient's clinical condition  HPI Patty Coleman is a 57 y.o. female who comes to the emergency department via EMS for shortness of breath.  When EMS arrived at the patient's home they found that she was wearing only a diaper and was quite cold with an axillary temperature of 94 degrees.  She is also short of breath.  Her husband noted a history of COPD and that the patient had been saturating 40% at home previously throughout the day.  Past Medical History:  Diagnosis Date  . Anxiety   . Cancer (HCC)    cervical stage I  . COPD (chronic obstructive pulmonary disease) ([place])   . Hypercholesteremia    previously on medications  . Urinary incontinence     Patient Active Problem List   Diagnosis Date Noted  . Insomnia due to medical condition 03/23/2017  . Chronic bilateral low back pain without sciatica 03/23/2017  . Shortness of breath   . Acute respiratory failure ([place]) 11/17/2016  . Goals of care, counseling/discussion   . DNR (do not resuscitate)   . Tobacco use 11/02/2012  . Anxiety 02/25/2012  . Hypercholesterolemia 10/07/2011  . COPD exacerbation ([place]) 08/28/2011  . Osteoarthritis 08/28/2011    Past Surgical History:  Procedure Laterality Date  . ABDOMINAL HYSTERECTOMY    . CESAREAN SECTION      Prior to Admission medications   Medication Sig Start Date End Date Taking? Authorizing Provider  budesonide (PULMICORT) 0.5 MG/2ML nebulizer solution VVN BID 03/09/17   [provider]  ipratropium-albuterol (DUONEB) 0.5-2.5 (3) MG/3ML SOLN Take 3 mLs by nebulization every 4 (four) hours as needed (shortness  of breath).    [provider]    Allergies Sulfa antibiotics; Eggs or egg-derived products; and Lincocin [lincomycin hcl]  Family History  Problem Relation Age of Onset  . COPD Mother   . Heart disease Father        heart attack and defibrillator  . Alzheimer's disease Father   . Irritable bowel syndrome Brother     Social History Social History   Tobacco Use  . Smoking status: Current Every Day Smoker    Packs/day: 1.25    Years: 35.00    Pack years: 43.75    Types: Cigarettes  . Smokeless tobacco: Current User  . Tobacco comment: would like to quit  Substance Use Topics  . Alcohol use: No  . Drug use: No    Review of Systems Level 5 exemption history limited by the patient's clinical condition ____________________________________________   PHYSICAL EXAM:  VITAL SIGNS: ED Triage Vitals  Enc Vitals Group     BP      Pulse      Resp      Temp      Temp src      SpO2      Weight      Height      Head Circumference      Peak Flow      Pain Score      Pain Loc      Pain Edu?      Excl. in [place]?     Constitutional: Somnolent taking frequent shallow  breaths minimally responsive to painful stimulus Eyes: PERRL EOMI. mid range and brisk Head: Atraumatic. Nose: No congestion/rhinnorhea. Mouth/Throat: No trismus Neck: No stridor.   Cardiovascular: Tachycardic rate, regular rhythm. Grossly normal heart sounds.  Good peripheral circulation. Respiratory: Tachypneic shallow breaths to coarse breath sounds throughout Gastrointestinal: Soft nontender Musculoskeletal: No lower extremity edema   Neurologic: Localizes all 4 to painful stimulus Skin:  Skin is warm, dry and intact. No rash noted. Psychiatric: Somnolent and comatose   ____________________________________________   DIFFERENTIAL includes but not limited to  Pneumonia, hypothermia, sepsis, metabolic derangement, hypercapnia ____________________________________________   LABS (all labs  ordered are listed, but only abnormal results are displayed)  Labs Reviewed  COMPREHENSIVE METABOLIC PANEL - Abnormal; Notable for the following components:      Result Value   Chloride 81 (*)    CO2 48 (*)    Glucose, Bld 118 (*)    BUN 29 (*)    ALT 10 (*)    All other components within normal limits  TROPONIN I - Abnormal; Notable for the following components:   Troponin I 0.11 (*)    All other components within normal limits  CBC WITH DIFFERENTIAL/PLATELET - Abnormal; Notable for the following components:   Hemoglobin 11.8 (*)    MCHC 29.1 (*)    RDW 14.7 (*)    Neutro Abs 6.8 (*)    Lymphs Abs 0.6 (*)    All other components within normal limits  BLOOD GAS, VENOUS - Abnormal; Notable for the following components:   pH, Ven 7.13 (*)    pCO2, Ven 120 (*)    All other components within normal limits  CULTURE, BLOOD (ROUTINE X 2)  CULTURE, BLOOD (ROUTINE X 2)  CK  LACTIC ACID, PLASMA  PROCALCITONIN  LACTIC ACID, PLASMA    Lab work reviewed by me with a number of abnormalities most notably a PCO2 of 120 concerning for acute hypoxic respiratory failure __________________________________________  EKG  ED ECG REPORT I, Darel Hong, the attending physician, personally viewed and interpreted this ECG.  Date: 05/03/2017 EKG Time:  Rate: 101 Rhythm: Sinus tachycardia QRS Axis: normal Intervals: normal ST/T Wave abnormalities: normal Narrative Interpretation: no evidence of acute ischemia  ____________________________________________  RADIOLOGY  Chest x-ray reviewed by me with suggestion of possible malignancy ____________________________________________   PROCEDURES  Procedure(s) performed: yes  Angiocath insertion Performed by: Darel Hong  Consent: Verbal consent obtained. Risks and benefits: risks, benefits and alternatives were discussed Time out: Immediately prior to procedure a "time out" was called to verify the correct patient, procedure,  equipment, support staff and site/side marked as required.  Preparation: Patient was prepped and draped in the usual sterile fashion.  Vein Location: Left external jugular  Gauge: 20  Normal blood return and flush without difficulty Patient tolerance: Patient tolerated the procedure well with no immediate complications.    Angiocath insertion Performed by: Darel Hong  Consent: Verbal consent obtained. Risks and benefits: risks, benefits and alternatives were discussed Time out: Immediately prior to procedure a "time out" was called to verify the correct patient, procedure, equipment, support staff and site/side marked as required.  Preparation: Patient was prepped and draped in the usual sterile fashion.  Vein Location: Right antecubital fossa  Ultrasound Guided  Gauge: 20  Normal blood return and flush without difficulty Patient tolerance: Patient tolerated the procedure well with no immediate complications.     .Critical Care Performed by: Darel Hong, MD Authorized by: Darel Hong, MD   Critical care provider  statement:    Critical care time (minutes):  40   Critical care time was exclusive of:  Separately billable procedures and treating other patients   Critical care was necessary to treat or prevent imminent or life-threatening deterioration of the following conditions:  Respiratory failure   Critical care was time spent personally by me on the following activities:  Development of treatment plan with patient or surrogate, discussions with consultants, evaluation of patient's response to treatment, examination of patient, obtaining history from patient or surrogate, ordering and performing treatments and interventions, ordering and review of laboratory studies, ordering and review of radiographic studies, pulse oximetry, re-evaluation of patient's condition and review of old charts    Critical Care performed: no  Observation:  no ____________________________________________   INITIAL IMPRESSION / ASSESSMENT AND PLAN / ED COURSE  Pertinent labs & imaging results that were available during my care of the patient were reviewed by me and considered in my medical decision making (see chart for details).  The patient arrives somnolent and critically ill with low oxygen saturation.  Lungs are not moving very much air so 2 duo nebs initiated.  Rectal temperature is 98.1 degrees.  The patient remained hypoxic despite 15 L of nonrebreather so she was initially begun on high flow nasal cannula, however quickly switched over to BiPAP in case that she was hypercarbic.  Is able to obtain IV access in her left external jugular vein and right deep brachial and we are checking broad labs and we will treat her as pneumonia and sepsis.  The husband confirms that the patient is DO NOT RESUSCITATE and DO NOT INTUBATE and he does understand she is critically ill and may die on this admission.    The patient's PCO2 was 120.  Placed her on BiPAP which she tolerated in an effort to blow off her CO2.  I will continue IV antibiotics and steroids, but at this point the patient requires inpatient admission for respiratory support.  Her husband verbalized understanding agreement the plan.  I then discussed with the hospitalist who is graciously agreed to admit the patient to his service.  ____________________________________________   FINAL CLINICAL IMPRESSION(S) / ED DIAGNOSES  Final diagnoses:  Acute respiratory failure with hypoxia and hypercarbia (HCC)  Elevated troponin  COPD exacerbation (HCC)      NEW MEDICATIONS STARTED DURING THIS VISIT:  New Prescriptions   No medications on file     Note:  This document was prepared using Dragon voice recognition software and may include unintentional dictation errors.     Darel Hong, MD 05/03/17 1425

## 2017-05-03 NOTE — Progress Notes (Signed)
Chaplain saw Pt's husband standing outside of Pt's room looking concerned. Chaplain engaged family to offer support. Husband talked about the challenges he is facing with the Pt's health and lifestyle. Chaplain prayed and assisted Husband in getting the P's Dr.   05/03/17 1100  Clinical Encounter Type  Visited With Patient and family together  Visit Type Initial;Spiritual support  Referral From Other (Comment)  Spiritual Encounters  Spiritual Needs Emotional  Stress Factors  Family Stress Factors Exhausted

## 2017-05-03 NOTE — ED Notes (Signed)
Attempted to call report and was requested to be called back

## 2017-05-03 NOTE — Progress Notes (Signed)
Unable to complete admission documentation due to pt's mental status and no family available to provide information.

## 2017-05-03 NOTE — ED Notes (Signed)
Dr, Mable Paris made aware of pt's oxygen saturation, also made aware of DNR form, pt's husband wanted MD to be aware of it. No new orders received

## 2017-05-03 NOTE — ED Triage Notes (Signed)
Pt presents to ER via [facility] with complaints of shortness of breath, pt has history of COPD, denies any other complaint. Pt able to answer yes not questions and able to says her name on oxygen 3L [place] at all times

## 2017-05-03 NOTE — ED Notes (Signed)
Pt difficult stick collected blood from one site Dr. Mable Paris made aware

## 2017-05-03 NOTE — ED Notes (Signed)
Pt placed on monitor. EKG performed

## 2017-05-03 NOTE — ED Notes (Signed)
RT was called to assist with transport to ICU - this nurse was advised it would be 10 min prior to arrival

## 2017-05-03 NOTE — H&P (Signed)
[facility] [place] NAME: Patty Coleman    MR#:  [id number]  DATE OF BIRTH:  November 03, 1960  DATE OF ADMISSION:  05/03/2017  PRIMARY CARE PHYSICIAN: Mikey College, NP   REQUESTING/REFERRING PHYSICIAN:   CHIEF COMPLAINT:   Chief Complaint  Patient presents with  . Shortness of Breath    HISTORY OF PRESENT ILLNESS: Patty Coleman  is a 57 y.o. female with a known history per below, chronic respiratory failure on 3 L at home chronically, presents emergency room with acute dyspnea/shortness of breath, O2 saturation in the 40s at home, temperature 94 degrees on presentation, placed on BiPAP given DNR status, noted tachycardia, tachypnea, hypoxia-73% in the emergency room noted, blood gas with pH of 7.1, PCO2 of 120, troponin 0.1, chest x-ray showed prominent right hilar area, pro-calcitonin negative, patient evaluated in the emergency room, no family present to give any details, patient is poor historian due to acute comatose state/unarousable to sternal rub.  Patient is now being admitted for acute comatose state secondary to acute hypercarbic hypoxic respiratory failure.  PAST MEDICAL HISTORY:   Past Medical History:  Diagnosis Date  . Anxiety   . Cancer (HCC)    cervical stage I  . COPD (chronic obstructive pulmonary disease) ([place])   . Hypercholesteremia    previously on medications  . Urinary incontinence     PAST SURGICAL HISTORY:  Past Surgical History:  Procedure Laterality Date  . ABDOMINAL HYSTERECTOMY    . CESAREAN SECTION      SOCIAL HISTORY:  Social History   Tobacco Use  . Smoking status: Current Every Day Smoker    Packs/day: 1.25    Years: 35.00    Pack years: 43.75    Types: Cigarettes  . Smokeless tobacco: Current User  . Tobacco comment: would like to quit  Substance Use Topics  . Alcohol use: No    FAMILY HISTORY:  Family History  Problem Relation Age of Onset  . COPD Mother   .  Heart disease Father        heart attack and defibrillator  . Alzheimer's disease Father   . Irritable bowel syndrome Brother     DRUG ALLERGIES:  Allergies  Allergen Reactions  . Sulfa Antibiotics Swelling    angioedema  . Eggs Or Egg-Derived Products Hives and Nausea Only  . Lincocin [Lincomycin Hcl] Hives    REVIEW OF SYSTEMS:  Unable to be obtained given comatose state  MEDICATIONS AT HOME:  Prior to Admission medications   Medication Sig Start Date End Date Taking? Authorizing Provider  predniSONE (STERAPRED UNI-PAK 21 TAB) 10 MG (21) TBPK tablet Take by mouth daily. Take 6 tablets by mouth for 1 day followed by  5 tablets by mouth for 1 day followed by  4 tablets by mouth for 1 day followed by  3 tablets by mouth for 1 day followed by  2 tablets by mouth for 1 day followed by  1 tablet by mouth for a day and stop 11/23/16  Yes Gouru, Aruna, MD  acetaminophen (TYLENOL) 325 MG tablet Take 1 tablet (325 mg total) by mouth every 6 (six) hours as needed for mild pain (or Fever >/= 101). Patient not taking: Reported on 05/03/2017 11/23/16   Nicholes Mango, MD  ALPRAZolam Duanne Moron) 0.5 MG tablet Take 1 tablet (0.5 mg total) by mouth 2 (two) times daily as needed for anxiety. Patient not taking: Reported on 05/03/2017 03/18/17   Cassell Smiles  Renee, NP  bisacodyl (DULCOLAX) 5 MG EC tablet Take 1 tablet (5 mg total) by mouth daily as needed for moderate constipation. Patient not taking: Reported on 05/03/2017 11/23/16   Nicholes Mango, MD  budesonide (PULMICORT) 0.5 MG/2ML nebulizer solution VVN BID 03/09/17   [provider]  busPIRone (BUSPAR) 5 MG tablet Take 1 tablet (5 mg total) by mouth 3 (three) times daily. Patient not taking: Reported on 05/03/2017 08/14/16   Mikey College, NP  feeding supplement, ENSURE ENLIVE, (ENSURE ENLIVE) LIQD Take 237 mLs by mouth 2 (two) times daily between meals. Patient not taking: Reported on 05/03/2017 11/23/16   Nicholes Mango, MD  guaiFENesin  (MUCINEX) 600 MG 12 hr tablet Take 1 tablet (600 mg total) by mouth 2 (two) times daily. Patient not taking: Reported on 05/03/2017 03/08/16   Dustin Flock, MD  ipratropium-albuterol (DUONEB) 0.5-2.5 (3) MG/3ML SOLN Take 3 mLs by nebulization every 4 (four) hours as needed (shortness of breath).    [provider]  Morphine Sulfate (MORPHINE CONCENTRATE) 10 MG/0.5ML SOLN concentrated solution Place 0.5 mLs (10 mg total) under the tongue every 8 (eight) hours as needed for moderate pain, severe pain or shortness of breath (air hunger). Patient not taking: Reported on 05/03/2017 11/23/16   Nicholes Mango, MD  nicotine (NICODERM CQ - DOSED IN MG/24 HOURS) 14 mg/24hr patch Place 1 patch (14 mg total) onto the skin daily. Patient not taking: Reported on 05/03/2017 11/23/16   Nicholes Mango, MD  ondansetron (ZOFRAN) 4 MG tablet Take 1 tablet (4 mg total) by mouth every 6 (six) hours as needed for nausea. Patient not taking: Reported on 05/03/2017 11/23/16   Nicholes Mango, MD  traMADol (ULTRAM) 50 MG tablet Take 1 tablet (50 mg total) by mouth every 12 (twelve) hours as needed for moderate pain. Patient not taking: Reported on 05/03/2017 03/18/17   Mikey College, NP  zolpidem (AMBIEN) 5 MG tablet Take 1 tablet (5 mg total) by mouth at bedtime as needed for sleep. Patient not taking: Reported on 05/03/2017 04/16/17   Mikey College, NP      PHYSICAL EXAMINATION:   VITAL SIGNS: Blood pressure (!) 121/56, pulse (!) 101, temperature 98.1 F (36.7 C), temperature source Rectal, resp. rate (!) 41, height 5\' 2"  (1.575 m), weight 66.7 kg (147 lb), SpO2 (!) 73 %.  GENERAL:  57 y.o.-year-old patient lying in the bed with no acute distress.  EYES: Pupils equal, round, reactive to light and accommodation. No scleral icterus.  HEENT: Head atraumatic, normocephalic. Oropharynx and nasopharynx clear.  NECK:  Supple, no jugular venous distention. No thyroid enlargement, no tenderness.  LUNGS: Severely  diminished breath sounds throughout.  Noted increased work of breathing on BiPAP.  CARDIOVASCULAR: S1, S2 normal. No murmurs, rubs, or gallops.  ABDOMEN: Soft, nontender, nondistended. Bowel sounds present. No organomegaly or mass.  EXTREMITIES: No pedal edema, cyanosis, or clubbing.  NEUROLOGIC: Comatose state. PERRL  PSYCHIATRIC: The patient is comatose   SKIN: No obvious rash, lesion, or ulcer.   LABORATORY PANEL:   CBC Recent Labs  Lab 05/03/17 1100  WBC 7.9  HGB 11.8*  HCT 40.6  PLT 162  MCV 92.4  MCH 26.9  MCHC 29.1*  RDW 14.7*  LYMPHSABS 0.6*  MONOABS 0.5  EOSABS 0.0  BASOSABS 0.0   ------------------------------------------------------------------------------------------------------------------  Chemistries  Recent Labs  Lab 05/03/17 1100  NA 141  K 4.4  CL 81*  CO2 48*  GLUCOSE 118*  BUN 29*  CREATININE 0.62  CALCIUM  9.2  AST 27  ALT 10*  ALKPHOS 64  BILITOT 0.8   ------------------------------------------------------------------------------------------------------------------ estimated creatinine clearance is 70.3 mL/min (by C-G formula based on SCr of 0.62 mg/dL). ------------------------------------------------------------------------------------------------------------------ No results for input(s): TSH, T4TOTAL, T3FREE, THYROIDAB in the last 72 hours.  Invalid input(s): FREET3   Coagulation profile No results for input(s): INR, PROTIME in the last 168 hours. ------------------------------------------------------------------------------------------------------------------- No results for input(s): DDIMER in the last 72 hours. -------------------------------------------------------------------------------------------------------------------  Cardiac Enzymes Recent Labs  Lab 05/03/17 1100  TROPONINI 0.11*   ------------------------------------------------------------------------------------------------------------------ Invalid input(s):  POCBNP  ---------------------------------------------------------------------------------------------------------------  Urinalysis    Component Value Date/Time   COLORURINE YELLOW (A) 11/17/2016 1418   APPEARANCEUR HAZY (A) 11/17/2016 1418   APPEARANCEUR Turbid 05/09/2012 1246   LABSPEC 1.011 11/17/2016 1418   LABSPEC 1.014 05/09/2012 1246   PHURINE 8.0 11/17/2016 1418   GLUCOSEU 50 (A) 11/17/2016 1418   GLUCOSEU Negative 05/09/2012 1246   HGBUR SMALL (A) 11/17/2016 1418   BILIRUBINUR NEGATIVE 11/17/2016 1418   BILIRUBINUR Negative 05/09/2012 1246   KETONESUR 20 (A) 11/17/2016 1418   PROTEINUR 100 (A) 11/17/2016 1418   NITRITE NEGATIVE 11/17/2016 1418   LEUKOCYTESUR NEGATIVE 11/17/2016 1418   LEUKOCYTESUR Negative 05/09/2012 1246     RADIOLOGY: Dg Chest Port 1 View  Result Date: 05/03/2017 CLINICAL DATA:  Shortness of breath. History of COPD, current smoker. EXAM: PORTABLE CHEST 1 VIEW COMPARISON:  Chest x-ray of November 17, 2016 FINDINGS: The lungs are well-expanded. There are chronic changes peripherally in the right mid upper hemithorax. There is prominence of the right hilar structures slightly more conspicuous than in the past. The left lung is well-expanded. There stable left apical pleural thickening. The heart and pulmonary vascularity are normal. IMPRESSION: Mild prominence of the right hilar structures more conspicuous than in the past. Stable pleural based density peripherally in the right mid upper hemithorax and in the left apex. Given the right hilar prominence, chest CT scanning would be useful in an effort to exclude occult malignancy. Electronically Signed   By: David  Martinique M.D.   On: 05/03/2017 10:32    EKG: Orders placed or performed during the hospital encounter of 05/03/17  . ED EKG  . ED EKG  . EKG 12-Lead  . EKG 12-Lead    IMPRESSION AND PLAN: 1 acute on chronic hypoxic hypercarbic respiratory failure with comatose state Most likely exacerbated by  acute on COPD exacerbation Noted DNR status, continue BiPAP, admit to ICU, rule out acute coronary syndrome with cardiac enzymes x3 sets, intensivist to see, IV Solu-Medrol with tapering as tolerated, aggressive pulmonary toilet with bronchodilator therapy, inhaled corticosteroids twice daily, IV fluids for rehydration, follow-up on cultures Noted baseline O2 requirement of 3 L via nasal cannula continuous at home  2 acute on COPD exacerbation Plan of care as stated above  3 acute comatose state Secondary to above Neurochecks per routine, continue BiPAP, aspiration/fall/skin care precautions  4 tobacco smoking abuse/dependency Nicotine patch daily  DNR status   All the records are reviewed and case discussed with ED provider. Management plans discussed with the patient, family and they are in agreement.  CODE STATUS: Code Status History    Date Active Date Inactive Code Status Order ID Comments User Context   11/17/2016 17:10 11/23/2016 17:33 DNR [id number]  Epifanio Lesches, MD ED   08/14/2016 17:01 11/17/2016 14:07 DNR [id number] Confirmed DNR.  Patient and husband verbalize understanding to withhold intubation, CPR, defibrillation, and ACLS protocol. Mikey College, NP Outpatient   03/06/2016 13:59 03/08/2016  14:31 DNR [id number]  Basilio Cairo, NP Inpatient   03/05/2016 01:36 03/06/2016 13:59 Full Code [id number]  Harvie Bridge, DO Inpatient    Questions for Most Recent Historical Code Status (Order [id number])    Question Answer Comment   In the event of cardiac or respiratory ARREST Do not call a "code blue"    In the event of cardiac or respiratory ARREST Do not perform Intubation, CPR, defibrillation or ACLS    In the event of cardiac or respiratory ARREST Use medication by any route, position, wound care, and other measures to relive pain and suffering. May use oxygen, suction and manual treatment of airway obstruction as needed for comfort.         Advance Directive  Documentation     Most Recent Value  Type of Advance Directive  Out of facility DNR (pink MOST or yellow form)  Pre-existing out of facility DNR order (yellow form or pink MOST form)  Yellow form placed in chart (order not valid for inpatient use)  "MOST" Form in Place?  No data       TOTAL TIME TAKING CARE OF THIS PATIENT: 45 minutes of critical care time was used to discuss case with supplemental staff, ED attending, review documentation, evaluate patient, discussed case with intensivist.    Avel Peace [place] M.D on 05/03/2017   Between 7am to 6pm - Pager - [phone]  After 6pm go to [email or web address] - password [email or web address] [facility]  Office  [phone]  CC: Primary care physician; Mikey College, NP   Note: This dictation was prepared with Dragon dictation along with smaller phrase technology. Any transcriptional errors that result from this process are unintentional.

## 2017-05-04 DIAGNOSIS — Z515 Encounter for palliative care: Secondary | ICD-10-CM

## 2017-05-04 DIAGNOSIS — J9602 Acute respiratory failure with hypercapnia: Secondary | ICD-10-CM

## 2017-05-04 LAB — BLOOD CULTURE ID PANEL (REFLEXED)
ACINETOBACTER BAUMANNII: NOT DETECTED
Candida albicans: NOT DETECTED
Candida glabrata: NOT DETECTED
Candida krusei: NOT DETECTED
Candida parapsilosis: NOT DETECTED
Candida tropicalis: NOT DETECTED
ENTEROBACTER CLOACAE COMPLEX: NOT DETECTED
Enterobacteriaceae species: NOT DETECTED
Enterococcus species: NOT DETECTED
Escherichia coli: NOT DETECTED
Haemophilus influenzae: NOT DETECTED
Klebsiella oxytoca: NOT DETECTED
Klebsiella pneumoniae: NOT DETECTED
LISTERIA MONOCYTOGENES: NOT DETECTED
Methicillin resistance: DETECTED — AB
NEISSERIA MENINGITIDIS: NOT DETECTED
PROTEUS SPECIES: NOT DETECTED
PSEUDOMONAS AERUGINOSA: NOT DETECTED
SERRATIA MARCESCENS: NOT DETECTED
STAPHYLOCOCCUS AUREUS BCID: NOT DETECTED
STAPHYLOCOCCUS SPECIES: DETECTED — AB
STREPTOCOCCUS AGALACTIAE: NOT DETECTED
STREPTOCOCCUS PNEUMONIAE: NOT DETECTED
Streptococcus pyogenes: NOT DETECTED
Streptococcus species: NOT DETECTED

## 2017-05-04 LAB — CBC
HCT: 37.2 % (ref 35.0–47.0)
Hemoglobin: 11.1 g/dL — ABNORMAL LOW (ref 12.0–16.0)
MCH: 27.3 pg (ref 26.0–34.0)
MCHC: 30 g/dL — ABNORMAL LOW (ref 32.0–36.0)
MCV: 91 fL (ref 80.0–100.0)
PLATELETS: 160 10*3/uL (ref 150–440)
RBC: 4.08 MIL/uL (ref 3.80–5.20)
RDW: 14.4 % (ref 11.5–14.5)
WBC: 7.6 10*3/uL (ref 3.6–11.0)

## 2017-05-04 LAB — URINALYSIS, ROUTINE W REFLEX MICROSCOPIC
BILIRUBIN URINE: NEGATIVE
Bacteria, UA: NONE SEEN
Glucose, UA: NEGATIVE mg/dL
KETONES UR: 20 mg/dL — AB
Nitrite: NEGATIVE
PROTEIN: 30 mg/dL — AB
Specific Gravity, Urine: 1.016 (ref 1.005–1.030)
pH: 6 (ref 5.0–8.0)

## 2017-05-04 LAB — MAGNESIUM: Magnesium: 2.4 mg/dL (ref 1.7–2.4)

## 2017-05-04 LAB — TROPONIN I: TROPONIN I: 0.3 ng/mL — AB (ref <0.03)

## 2017-05-04 LAB — PHOSPHORUS: PHOSPHORUS: 4.9 mg/dL — AB (ref 2.5–4.6)

## 2017-05-04 LAB — GLUCOSE, CAPILLARY: Glucose-Capillary: 125 mg/dL — ABNORMAL HIGH (ref 65–99)

## 2017-05-04 MED ORDER — VANCOMYCIN HCL IN DEXTROSE 1-5 GM/200ML-% IV SOLN
1000.0000 mg | Freq: Once | INTRAVENOUS | Status: AC
  Administered 2017-05-04: 1000 mg via INTRAVENOUS
  Filled 2017-05-04: qty 200

## 2017-05-04 MED ORDER — METHYLPREDNISOLONE SODIUM SUCC 40 MG IJ SOLR
40.0000 mg | Freq: Two times a day (BID) | INTRAMUSCULAR | Status: DC
  Administered 2017-05-04 – 2017-05-07 (×6): 40 mg via INTRAVENOUS
  Filled 2017-05-04 (×7): qty 1

## 2017-05-04 MED ORDER — FAMOTIDINE IN NACL 20-0.9 MG/50ML-% IV SOLN
20.0000 mg | Freq: Two times a day (BID) | INTRAVENOUS | Status: DC
  Administered 2017-05-04 – 2017-05-07 (×7): 20 mg via INTRAVENOUS
  Filled 2017-05-04 (×7): qty 50

## 2017-05-04 MED ORDER — LORAZEPAM 2 MG/ML IJ SOLN
0.5000 mg | Freq: Four times a day (QID) | INTRAMUSCULAR | Status: DC | PRN
  Administered 2017-05-04 – 2017-05-05 (×2): 0.5 mg via INTRAVENOUS
  Filled 2017-05-04 (×2): qty 1

## 2017-05-04 MED ORDER — MORPHINE SULFATE (PF) 2 MG/ML IV SOLN
2.0000 mg | INTRAVENOUS | Status: DC | PRN
  Administered 2017-05-04 – 2017-05-07 (×4): 2 mg via INTRAVENOUS
  Filled 2017-05-04 (×4): qty 1

## 2017-05-04 NOTE — Progress Notes (Signed)
PULMONARY / CRITICAL CARE MEDICINE   Name: Patty Coleman MRN: [id number] DOB: 14-Aug-1960    ADMISSION DATE:  05/03/2017 CONSULTATION DATE:  05/03/2017  REFERRING MD:  Dr. Jerelyn Charles  CHIEF COMPLAINT:  Unresponsive  HISTORY OF PRESENT ILLNESS:   History obtained from chart review. 57 year-old female with PMH as below found unresponsive and dyspneic with O2 saturations in the 40s at home. Pt also hypothermic at 94 degrees. Initial VBG 7.1/120/??/48. Pt made DNR in the ED and started on bipap. CXR with right hilar prominence, negative PCT, troponin 0.11, EKG with non-specific ST-segment depressions and RAE. Admitted to ICU with coma secondary to hypercarbic hypoxic respiratory failure.   Patient with previous ICU admission for severe COPD Patient with end stage COPD      SOCIAL HISTORY: She  reports that she has been smoking cigarettes.  She has a 43.75 pack-year smoking history. She uses smokeless tobacco. She reports that she does not drink alcohol or use drugs.  REVIEW OF SYSTEMS:   Not done 2/2 unresponsive.  SUBJECTIVE:  Critically ill-appearing  lying in hospital bed on NIV.  biPAP settings TV 400, RR 15, IPAP 20 EPAP 5 fio2 at 100% Follow up ABG shows severe resp acidosis with pH 7.34/120/60  Patient more alert, not following commands  respond to vocal stimuli  Husband Chuck at bedside-updated and notified  +blood cultures 2/4 Staph species  VITAL SIGNS: BP 125/65   Pulse 90   Temp 98.8 F (37.1 C) (Axillary)   Resp 20   Ht 5\' 4"  (1.626 m)   Wt 62.2 kg (137 lb 2 oz)   SpO2 100%   BMI 23.54 kg/m      VENTILATOR SETTINGS: FiO2 (%):  [70 %-100 %] 100 %  PHYSICAL EXAMINATION: General: Critically ill-appearing, biPAP in place Neuro:  Alert, not following commands. GCS 12. HEENT: NCAT. PEERL  Cardiovascular: S1, S2, RRR no m/g/r. No LE edema. Pulses in LE non-palpable, +doppler.  Lungs:  diminished respiratory effort. BS with some transmitted upper  airway sounds Abdomen:  Soft non-distended Musculoskeletal:  No gross abnormalities Skin:  Acyanotic, no lesions.  LABS:  BMET Recent Labs  Lab 05/03/17 1100  NA 141  K 4.4  CL 81*  CO2 48*  BUN 29*  CREATININE 0.62  GLUCOSE 118*    Electrolytes Recent Labs  Lab 05/03/17 1100 05/04/17 0248  CALCIUM 9.2  --   MG  --  2.4  PHOS  --  4.9*    CBC Recent Labs  Lab 05/03/17 1100 05/04/17 0607  WBC 7.9 7.6  HGB 11.8* 11.1*  HCT 40.6 37.2  PLT 162 160    Coag's No results for input(s): APTT, INR in the last 168 hours.  Sepsis Markers Recent Labs  Lab 05/03/17 1100 05/03/17 1101 05/03/17 1507  LATICACIDVEN 1.9  --  0.8  PROCALCITON  --  <0.10  --     ABG Recent Labs  Lab 05/03/17 1500 05/03/17 2029 05/04/17 0539  PHART 6.93* 7.07* 7.34*  PCO2ART 120* 120* 120*  PO2ART 70* 239* 60*    Liver Enzymes Recent Labs  Lab 05/03/17 1100  AST 27  ALT 10*  ALKPHOS 64  BILITOT 0.8  ALBUMIN 3.5    Cardiac Enzymes Recent Labs  Lab 05/03/17 1507 05/03/17 2039 05/04/17 0248  TROPONINI 0.23* 0.26* 0.30*    Glucose Recent Labs  Lab 05/03/17 1438 05/04/17 0033  GLUCAP 146* 125*    Imaging Dg Chest Port 1 View  Result Date: 05/03/2017  CLINICAL DATA:  Shortness of breath. History of COPD, current smoker. EXAM: PORTABLE CHEST 1 VIEW COMPARISON:  Chest x-ray of November 17, 2016 FINDINGS: The lungs are well-expanded. There are chronic changes peripherally in the right mid upper hemithorax. There is prominence of the right hilar structures slightly more conspicuous than in the past. The left lung is well-expanded. There stable left apical pleural thickening. The heart and pulmonary vascularity are normal. IMPRESSION: Mild prominence of the right hilar structures more conspicuous than in the past. Stable pleural based density peripherally in the right mid upper hemithorax and in the left apex. Given the right hilar prominence, chest CT scanning would be  useful in an effort to exclude occult malignancy. Electronically Signed   By: David  Martinique M.D.   On: 05/03/2017 10:32   I have Independently reviewed images of  CXR   on 05/04/2017 Interpretation:RT hilar prominance, no acute densiity    STUDIES:  CXR with right hilar prominence   SIGNIFICANT EVENTS: 1/21 Brought to ED via EMS with dyspnea and unresponsiveness. Pt with profound respiratory acidosis. Troponin elevated, EKG with non-specific ST depression. PCT negative. 1/21 Pt made DNR/DNI 1/21 Admitted to ICU on bipap, blood gas shows worsening acidosis with ph 6.9. 1/21 Spoke with pt's husband and daughter about pt's poor prognosis. Family is enroute from [place] and would like to continue NIV until they arrive.   1/22 Mental status much improved, pt alert but not following commands. PH normalizing, PCO2 still > 120.  LINES/TUBES: 20g IV in left EJ  DISCUSSION: Pt presented unresponsive with critical respiratory failure and profound respiratory acidosis. With bipap and bronchodilator treatment, mental status has improved markedly. Overall goals of care need to be clarified to guide interventions.  ASSESSMENT / PLAN: 57 yo WF admitted to to ICU for acute and severe resp failure from severe end stage COPD with progressive metabolic encephalopathy from severe hypercapnia and severe hypoxia.  Patient is DNR/DNi as confirmed with husband at bedside    PULMONARY A: Acute respiratory failure with hypoxia and hypercarbia P:   Pt is DNR/DNI.  continue bipap at current settings Continue bronchodilators and steroids.  Follow up ABG pending in next 24 hrs  CARDIOVASCULAR A:  Elevated troponin in the setting of acute critical illness P:  Unlikely a primary cardiac etiology given history. With a very poor prognosis, will not continue to follow troponin values.   RENAL A:   Acute respiratory acidosis on chronic respiratory acidosis, improving 7.34/120/60 P:   DNR/DNI. Will not  intubate.   GASTROINTESTINAL A:   No problems P:   SUP Keep NPO  HEMATOLOGIC A:   No problems P:   DVT prophylaxis   INFECTIOUS A:   No problems P:   Follow up ID recs  ENDOCRINE A:   No problems   P:   none  NEUROLOGIC A:   Acute encephalopathy in the setting of significant hypercarbia/hypoxia P:   Treatment as above  After further assessment patient with severe end stage COPD with ongoing tobacco abuse previously on Hospice Patient with long standing SOB and increased WOB and poor resp effort and very poor quality of life  I recommend Palliative care consult and assess for Home with Hospice  Overall, patient remains critically ill and poor prognosis   Critical Care Time devoted to patient care services described in this note is 36 minutes.   Overall, patient is critically ill, prognosis is guarded.  Patient with Multiorgan failure and at high risk for cardiac  arrest and death.    Corrin Parker, M.D.  [facility]  Medical Director [place] Director [facility] Cardio-Pulmonary Department

## 2017-05-04 NOTE — Progress Notes (Deleted)
Patient walked 3 laps around the unit.  Tolerated well. No shortness of breath- or pain.  HR in 90's and BP stable.  Patient sitting in chair eating lunch at this time.  Wife at bedside.     

## 2017-05-04 NOTE — Progress Notes (Signed)
[facility] NAME: Patty Coleman    MR#:  [id number]  DATE OF BIRTH:  [birth year]  SUBJECTIVE: Admitted yesterday for hypercapnia, lethargy on BiPAP since admission.  She  is more awake and alert.  Husband at the bedside.  CHIEF COMPLAINT:   Chief Complaint  Patient presents with  . Shortness of Breath    REVIEW OF SYSTEMS:   ROS CONSTITUTIONAL: No fever, fatigue or weakness.  EYES: No blurred or double vision.  EARS, NOSE, AND THROAT: No tinnitus or ear pain.  RESPIRATORY: No cough, shortness of breath, wheezing or hemoptysis.  CARDIOVASCULAR: No chest pain, orthopnea, edema.  GASTROINTESTINAL: No nausea, vomiting, diarrhea or abdominal pain.  GENITOURINARY: No dysuria, hematuria.  ENDOCRINE: No polyuria, nocturia,  HEMATOLOGY: No anemia, easy bruising or bleeding SKIN: No rash or lesion. MUSCULOSKELETAL: No joint pain or arthritis.   NEUROLOGIC: No tingling, numbness, weakness.  PSYCHIATRY: Slightly anxious.  DRUG ALLERGIES:   Allergies  Allergen Reactions  . Sulfa Antibiotics Swelling    angioedema  . Eggs Or Egg-Derived Products Hives and Nausea Only  . Lincocin [Lincomycin Hcl] Hives    VITALS:  Blood pressure (!) 159/69, pulse 99, temperature 98.2 F (36.8 C), temperature source Axillary, resp. rate (!) 44, height 5\' 4"  (1.626 m), weight 62.2 kg (137 lb 2 oz), SpO2 90 %.  PHYSICAL EXAMINATION:  GENERAL:  57 y.o.-year-old patient lying in the bed with no acute distress.  BiPAP mask present on the nose and mouth EYES: Pupils equal, round, reactive to light and accommodation. No scleral icterus. Extraocular muscles intact.  HEENT: Head atraumatic, normocephalic. Oropharynx and nasopharynx clear.  NECK:  Supple, no jugular venous distention. No thyroid enlargement, no tenderness.  LUNGS: Decreased breath sounds bilaterally.  Rales,rhonchi or crepitation. No use of accessory muscles of respiration.   CARDIOVASCULAR: S1, S2 normal. No murmurs, rubs, or gallops.  ABDOMEN: Soft, nontender, nondistended. Bowel sounds present. No organomegaly or mass.  EXTREMITIES: No pedal edema, cyanosis, or clubbing.  NEUROLOGIC: Unable to do full neurologic exam because she is on BiPAP mask.  But able to move all extremities.  PSYCHIATRIC: The patient is alert and oriented x 3.  SKIN: No obvious rash, lesion, or ulcer.    LABORATORY PANEL:   CBC Recent Labs  Lab 05/04/17 0607  WBC 7.6  HGB 11.1*  HCT 37.2  PLT 160   ------------------------------------------------------------------------------------------------------------------  Chemistries  Recent Labs  Lab 05/03/17 1100 05/04/17 0248  NA 141  --   K 4.4  --   CL 81*  --   CO2 48*  --   GLUCOSE 118*  --   BUN 29*  --   CREATININE 0.62  --   CALCIUM 9.2  --   MG  --  2.4  AST 27  --   ALT 10*  --   ALKPHOS 64  --   BILITOT 0.8  --    ------------------------------------------------------------------------------------------------------------------  Cardiac Enzymes Recent Labs  Lab 05/04/17 0248  TROPONINI 0.30*   ------------------------------------------------------------------------------------------------------------------  RADIOLOGY:  Dg Chest Port 1 View  Result Date: 05/03/2017 CLINICAL DATA:  Shortness of breath. History of COPD, current smoker. EXAM: PORTABLE CHEST 1 VIEW COMPARISON:  Chest x-ray of November 17, 2016 FINDINGS: The lungs are well-expanded. There are chronic changes peripherally in the right mid upper hemithorax. There is prominence of the right hilar structures slightly more conspicuous than in the past. The left lung is well-expanded. There stable left apical  pleural thickening. The heart and pulmonary vascularity are normal. IMPRESSION: Mild prominence of the right hilar structures more conspicuous than in the past. Stable pleural based density peripherally in the right mid upper hemithorax and in the  left apex. Given the right hilar prominence, chest CT scanning would be useful in an effort to exclude occult malignancy. Electronically Signed   By: David  Martinique M.D.   On: 05/03/2017 10:32    EKG:   Orders placed or performed during the hospital encounter of 05/03/17  . ED EKG  . ED EKG  . EKG 12-Lead  . EKG 12-Lead    ASSESSMENT AND PLAN:  Acute on chronic respiratory failure with hypoxia, hypercapnia: Continue BiPAP, patient DNR.  Lethargy is improving, discussed with husband wants to talk to hospice liaison.  Patient is more awake and alert.  Husband is wondering if she can have something to drink. #2 end-stage COPD with hypercapnia and hypoxia: Continue IV steroids, bronchodilators, BiPAP, made DNR in the emergency room husband is at the bedside and confirmed this.  Patient is a heavy smoker still continues to smoke.  Prognosis poor,  3.  Metabolic encephalopathy with hypercarbia, hypoxia: Slowly improving, she is more alert today.  Palliative care, hospice consults because of her severe COPD 4.  Prognosis poor CODE STATUS DNR   All the records are reviewed and case discussed with Care Management/Social Workerr. Management plans discussed with the patient, family and they are in agreement.  CODE STATUS: DNR  TOTAL TIME TAKING CARE OF THIS PATIENT: 35 minutes.    Epifanio Lesches M.D on 05/04/2017 at 1:46 PM  Between 7am to 6pm - Pager - [phone]  After 6pm go to [email or web address] - password [email or web address] [facility]  [facility]  Office  [phone]  CC: Primary care physician; Mikey College, NP   Note: This dictation was prepared with Dragon dictation along with smaller phrase technology. Any transcriptional errors that result from this process are unintentional.

## 2017-05-04 NOTE — Consult Note (Signed)
[facility] Infectious Disease     Reason for Consult: Bacteremia, COPD exacerbation   Referring Physician: Governor Specking Date of Admission:  05/03/2017   Active Problems:   Acute respiratory failure ([place])   HPI: Patty Coleman is a 57 y.o. female with COPD on home O2 3L admitted with severe resp distress and AMS.  On admit no fever, wbc 7. Started on ceftriaxone and azithromycin. bcx 2/2 + GPC - staph species.  She is on Bipap and remains confused. No fevers since admit. Has had one dose of vancomycin.    Past Medical History:  Diagnosis Date  . Anxiety   . Cancer (HCC)    cervical stage I  . COPD (chronic obstructive pulmonary disease) ([place])   . Hypercholesteremia    previously on medications  . Urinary incontinence    Past Surgical History:  Procedure Laterality Date  . ABDOMINAL HYSTERECTOMY    . CESAREAN SECTION     Social History   Tobacco Use  . Smoking status: Current Every Day Smoker    Packs/day: 1.25    Years: 35.00    Pack years: 43.75    Types: Cigarettes  . Smokeless tobacco: Current User  . Tobacco comment: would like to quit  Substance Use Topics  . Alcohol use: No  . Drug use: No   Family History  Problem Relation Age of Onset  . COPD Mother   . Heart disease Father        heart attack and defibrillator  . Alzheimer's disease Father   . Irritable bowel syndrome Brother     Allergies:  Allergies  Allergen Reactions  . Sulfa Antibiotics Swelling    angioedema  . Eggs Or Egg-Derived Products Hives and Nausea Only  . Lincocin [Lincomycin Hcl] Hives    Current antibiotics: Antibiotics Given (last 72 hours)    Date/Time Action Medication Dose Rate   05/03/17 1121 New Bag/Given   cefTRIAXone (ROCEPHIN) 1 g in dextrose 5 % 50 mL IVPB - Premix 1 g 100 mL/hr   05/03/17 1121 New Bag/Given   azithromycin (ZITHROMAX) 500 mg in dextrose 5 % 250 mL IVPB 500 mg 250 mL/hr   05/04/17 0755 New Bag/Given   vancomycin (VANCOCIN) IVPB 1000  mg/200 mL premix 1,000 mg 200 mL/hr      MEDICATIONS: . budesonide  0.5 mg Nebulization BID  . chlorhexidine  15 mL Mouth Rinse BID  . heparin  5,000 Units Subcutaneous Q8H  . ipratropium-albuterol  3 mL Nebulization Q4H  . mouth rinse  15 mL Mouth Rinse q12n4p  . methylPREDNISolone (SOLU-MEDROL) injection  40 mg Intravenous Q12H  . nicotine  14 mg Transdermal Daily  . sodium chloride flush  3 mL Intravenous Q12H    Review of Systems -unable to obtain  OBJECTIVE: Temp:  [97.4 F (36.3 C)-98.8 F (37.1 C)] 98.2 F (36.8 C) (01/22 1300) Pulse Rate:  [76-99] 99 (01/22 1300) Resp:  [15-44] 44 (01/22 1300) BP: (99-159)/(48-76) 159/69 (01/22 1300) SpO2:  [90 %-100 %] 90 % (01/22 1300) FiO2 (%):  [70 %-100 %] 100 % (01/22 0700) Physical Exam  Constitutional:  Confused, on bipap, ill appearing HENT: [place]/AT, PERRLA, no scleral icterus Mouth/Throat: Bipap mask on Cardiovascular: tachy Pulmonary/Chest: poor air movment Neck = supple, no nuchal rigidity Abdominal: Soft. Bowel sounds are normal.  exhibits no distension. There is no tenderness.  Lymphadenopathy: no cervical adenopathy. No axillary adenopathy Neurological confused Skin: Skin is warm and dry. No rash noted. No erythema.  Psychiatric:  a normal mood and affect.  behavior is normal.    LABS: Results for orders placed or performed during the hospital encounter of 05/03/17 (from the past 48 hour(s))  Comprehensive metabolic panel     Status: Abnormal   Collection Time: 05/03/17 11:00 AM  Result Value Ref Range   Sodium 141 135 - 145 mmol/L   Potassium 4.4 3.5 - 5.1 mmol/L   Chloride 81 (L) 101 - 111 mmol/L   CO2 48 (H) 22 - 32 mmol/L    Comment: RESULT CONFIRMED BY MANUAL DILUTION   Glucose, Bld 118 (H) 65 - 99 mg/dL   BUN 29 (H) 6 - 20 mg/dL   Creatinine, Ser 0.62 0.44 - 1.00 mg/dL   Calcium 9.2 8.9 - 10.3 mg/dL   Total Protein 6.9 6.5 - 8.1 g/dL   Albumin 3.5 3.5 - 5.0 g/dL   AST 27 15 - 41 U/L   ALT 10 (L) 14 -  54 U/L   Alkaline Phosphatase 64 38 - 126 U/L   Total Bilirubin 0.8 0.3 - 1.2 mg/dL   GFR calc non Af Amer >60 >60 mL/min   GFR calc Af Amer >60 >60 mL/min    Comment: (NOTE) The eGFR has been calculated using the CKD EPI equation. This calculation has not been validated in all clinical situations. eGFR's persistently <60 mL/min signify possible Chronic Kidney Disease.    Anion gap 12 5 - 15    Comment: Performed at [facility], [address] [place], [place] [address]  Troponin I     Status: Abnormal   Collection Time: 05/03/17 11:00 AM  Result Value Ref Range   Troponin I 0.11 (HH) <0.03 ng/mL    Comment: CRITICAL RESULT CALLED TO, READ BACK BY AND VERIFIED WITH TERESA CLAPP 05/03/17 @ [place] Performed at [facility], [address] [place], [place] [address]   CBC with Differential     Status: Abnormal   Collection Time: 05/03/17 11:00 AM  Result Value Ref Range   WBC 7.9 3.6 - 11.0 K/uL   RBC 4.40 3.80 - 5.20 MIL/uL   Hemoglobin 11.8 (L) 12.0 - 16.0 g/dL   HCT 40.6 35.0 - 47.0 %    Comment: RESULT REPEATED AND VERIFIED   MCV 92.4 80.0 - 100.0 fL   MCH 26.9 26.0 - 34.0 pg   MCHC 29.1 (L) 32.0 - 36.0 g/dL   RDW 14.7 (H) 11.5 - 14.5 %   Platelets 162 150 - 440 K/uL   Neutrophils Relative % 87 %   Lymphocytes Relative 7 %   Monocytes Relative 6 %   Eosinophils Relative 0 %   Basophils Relative 0 %   Neutro Abs 6.8 (H) 1.4 - 6.5 K/uL   Lymphs Abs 0.6 (L) 1.0 - 3.6 K/uL   Monocytes Absolute 0.5 0.2 - 0.9 K/uL   Eosinophils Absolute 0.0 0 - 0.7 K/uL   Basophils Absolute 0.0 0 - 0.1 K/uL   RBC Morphology STOMATOCYTES     Comment: POLYCHROMASIA PRESENT Performed at [facility], [address] [place], [place] [address]   CK     Status: None   Collection Time: 05/03/17 11:00 AM  Result Value Ref Range   Total CK 77 38 - 234 U/L    Comment: Performed at [facility], [address] [place], [place] [address]  Lactic acid,  plasma     Status: None   Collection Time: 05/03/17 11:00 AM  Result Value Ref  Range   Lactic Acid, Venous 1.9 0.5 - 1.9 mmol/L    Comment: Performed at [facility], [address] [place], [place] [address]  Blood Culture (routine x 2)     Status: None (Preliminary result)   Collection Time: 05/03/17 11:01 AM  Result Value Ref Range   Specimen Description      BLOOD BLOOD RIGHT ARM Performed at [facility], [place] [address] [place], [place] [address]    Special Requests      BOTTLES DRAWN AEROBIC AND ANAEROBIC Blood Culture adequate volume   Culture  Setup Time      Organism ID to follow GRAM POSITIVE COCCI IN BOTH AEROBIC AND ANAEROBIC BOTTLES CRITICAL RESULT CALLED TO, READ BACK BY AND VERIFIED WITHCatalina Antigua [facility] AT 5726 05/04/17 [place] Performed at [facility], [address] [place], [place] [address]    Culture GRAM POSITIVE COCCI    Report Status PENDING   Blood Culture (routine x 2)     Status: None (Preliminary result)   Collection Time: 05/03/17 11:01 AM  Result Value Ref Range   Specimen Description      BLOOD BLOOD RIGHT ARM Performed at [facility], [place] [address] [place], [place] [address]    Special Requests      BOTTLES DRAWN AEROBIC AND ANAEROBIC Blood Culture adequate volume   Culture  Setup Time      GRAM POSITIVE COCCI IN BOTH AEROBIC AND ANAEROBIC BOTTLES CRITICAL VALUE NOTED.  VALUE IS CONSISTENT WITH PREVIOUSLY REPORTED AND CALLED VALUE. Performed at [facility], [address] [place], [place] [address]    Culture GRAM POSITIVE COCCI    Report Status PENDING   Procalcitonin     Status: None   Collection Time: 05/03/17 11:01 AM  Result Value Ref Range   Procalcitonin <0.10 ng/mL    Comment:        Interpretation: PCT (Procalcitonin) <= 0.5 ng/mL: Systemic infection (sepsis) is not likely. Local bacterial infection is possible. (NOTE)       Sepsis PCT Algorithm           Lower Respiratory Tract                                       Infection PCT Algorithm    ----------------------------     ----------------------------         PCT < 0.25 ng/mL                PCT < 0.10 ng/mL         Strongly encourage             Strongly discourage   discontinuation of antibiotics    initiation of antibiotics    ----------------------------     -----------------------------       PCT 0.25 - 0.50 ng/mL            PCT 0.10 - 0.25 ng/mL               OR       >80% decrease in PCT            Discourage initiation of  antibiotics      Encourage discontinuation           of antibiotics    ----------------------------     -----------------------------         PCT >= 0.50 ng/mL              PCT 0.26 - 0.50 ng/mL               AND        <80% decrease in PCT             Encourage initiation of                                             antibiotics       Encourage continuation           of antibiotics    ----------------------------     -----------------------------        PCT >= 0.50 ng/mL                  PCT > 0.50 ng/mL               AND         increase in PCT                  Strongly encourage                                      initiation of antibiotics    Strongly encourage escalation           of antibiotics                                     -----------------------------                                           PCT <= 0.25 ng/mL                                                 OR                                        > 80% decrease in PCT                                     Discontinue / Do not initiate                                             antibiotics Performed at [facility], [address] [place], [place] [address]   Blood gas, venous     Status: Abnormal (Preliminary result)   Collection Time:  05/03/17 11:01 AM  Result Value Ref Range   FIO2 1.00    Delivery systems BILEVEL POSITIVE AIRWAY PRESSURE    pH, Ven 7.13 (LL)  7.250 - 7.430    Comment: CRITICAL RESULT CALLED TO, READ BACK BY AND VERIFIED WITH: DR Mable [place] [id number] ABL    pCO2, Ven 120 (HH) 44.0 - 60.0 mmHg    Comment: CRITICAL RESULT CALLED TO, READ BACK BY AND VERIFIED WITH: DR Mable Paris [id number] ABL    pO2, Ven PENDING 32.0 - 45.0 mmHg   Patient temperature 37.0    Collection site VEIN    Sample type VENOUS     Comment: Performed at [facility], [address], [place], [place] [address]  Blood Culture ID Panel (Reflexed)     Status: Abnormal   Collection Time: 05/03/17 11:01 AM  Result Value Ref Range   Enterococcus species NOT DETECTED NOT DETECTED   Listeria monocytogenes NOT DETECTED NOT DETECTED   Staphylococcus species DETECTED (A) NOT DETECTED    Comment: Methicillin (oxacillin) resistant coagulase negative staphylococcus. Possible blood culture contaminant (unless isolated from more than one blood culture draw or clinical case suggests pathogenicity). No antibiotic treatment is indicated for blood  culture contaminants. CRITICAL RESULT CALLED TO, READ BACK BY AND VERIFIED WITH:  MATT MCBANE AT 2248 05/04/17 SDR    Staphylococcus aureus NOT DETECTED NOT DETECTED   Methicillin resistance DETECTED (A) NOT DETECTED    Comment: CRITICAL RESULT CALLED TO, READ BACK BY AND VERIFIED WITH:  MATT MCBANE AT 2500 05/04/17 SDR    Streptococcus species NOT DETECTED NOT DETECTED   Streptococcus agalactiae NOT DETECTED NOT DETECTED   Streptococcus pneumoniae NOT DETECTED NOT DETECTED   Streptococcus pyogenes NOT DETECTED NOT DETECTED   Acinetobacter baumannii NOT DETECTED NOT DETECTED   Enterobacteriaceae species NOT DETECTED NOT DETECTED   Enterobacter cloacae complex NOT DETECTED NOT DETECTED   Escherichia coli NOT DETECTED NOT DETECTED   Klebsiella oxytoca NOT DETECTED NOT DETECTED   Klebsiella pneumoniae NOT DETECTED NOT DETECTED   Proteus species NOT DETECTED NOT DETECTED   Serratia marcescens NOT DETECTED NOT  DETECTED   Haemophilus influenzae NOT DETECTED NOT DETECTED   Neisseria meningitidis NOT DETECTED NOT DETECTED   Pseudomonas aeruginosa NOT DETECTED NOT DETECTED   Candida albicans NOT DETECTED NOT DETECTED   Candida glabrata NOT DETECTED NOT DETECTED   Candida krusei NOT DETECTED NOT DETECTED   Candida parapsilosis NOT DETECTED NOT DETECTED   Candida tropicalis NOT DETECTED NOT DETECTED    Comment: Performed at [facility], [address] [place], [place] [address]  Glucose, capillary     Status: Abnormal   Collection Time: 05/03/17  2:38 PM  Result Value Ref Range   Glucose-Capillary 146 (H) 65 - 99 mg/dL  Blood gas, arterial     Status: Abnormal (Preliminary result)   Collection Time: 05/03/17  3:00 PM  Result Value Ref Range   FIO2 1.00    Delivery systems BILEVEL POSITIVE AIRWAY PRESSURE    LHR 15 resp/min   Inspiratory PAP 20    Expiratory PAP 5    pH, Arterial 6.93 (LL) 7.350 - 7.450    Comment: CRITICAL RESULT CALLED TO, READ BACK BY AND VERIFIED WITH: NOTIFIED TO SABRINA, RN AT 1508 ON 05/03/17 BY GKM    pCO2 arterial 120 (HH) 32.0 - 48.0 mmHg    Comment: CRITICAL RESULT CALLED TO, READ BACK BY AND VERIFIED WITH: NOTIFIED TO [place], RN AT 1508 ON 05/03/17 BY GKM  pO2, Arterial 70 (L) 83.0 - 108.0 mmHg   Bicarbonate INCALCULABLE 20.0 - 28.0 mmol/L   Acid-Base Excess PENDING 0.0 - 2.0 mmol/L   O2 Saturation INCALCULABLE %   Patient temperature 37.0    Collection site RIGHT RADIAL    Sample type ARTERIAL DRAW    Allens test (pass/fail) PASS PASS   Mechanical Rate 15     Comment: Performed at [facility], [address] [place], [place] [address]  Lactic acid, plasma     Status: None   Collection Time: 05/03/17  3:07 PM  Result Value Ref Range   Lactic Acid, Venous 0.8 0.5 - 1.9 mmol/L    Comment: Performed at [facility], [address] [place], [place] [address]  Troponin I (q 6hr x 3)     Status: Abnormal   Collection Time:  05/03/17  3:07 PM  Result Value Ref Range   Troponin I 0.23 (HH) <0.03 ng/mL    Comment: CRITICAL VALUE NOTED. VALUE IS CONSISTENT WITH PREVIOUSLY REPORTED/CALLED VALUE [facility] Performed at [facility], [address] [place], [place] [address]   MRSA PCR Screening     Status: None   Collection Time: 05/03/17  4:45 PM  Result Value Ref Range   MRSA by PCR NEGATIVE NEGATIVE    Comment:        The GeneXpert MRSA Assay (FDA approved for NASAL specimens only), is one component of a comprehensive MRSA colonization surveillance program. It is not intended to diagnose MRSA infection nor to guide or monitor treatment for MRSA infections. Performed at [facility], [address] [place], [place] [address]   Blood gas, arterial     Status: Abnormal   Collection Time: 05/03/17  8:29 PM  Result Value Ref Range   Delivery systems BILEVEL POSITIVE AIRWAY PRESSURE    pH, Arterial 7.07 (LL) 7.350 - 7.450    Comment: CRITICAL RESULT, NOTIFIED PHYSICIAN M TUKOV [id number] 2106 BY HC    pCO2 arterial 120 (HH) 32.0 - 48.0 mmHg    Comment: CRITICAL RESULT, NOTIFIED PHYSICIAN M TUKOV [id number] 2106 HC    pO2, Arterial 239 (H) 83.0 - 108.0 mmHg   Patient temperature 37.0    Collection site RIGHT RADIAL    Sample type ARTERIAL DRAW    Allens test (pass/fail) PASS PASS    Comment: Performed at [facility], [address] [place], [place] [address]  Troponin I (q 6hr x 3)     Status: Abnormal   Collection Time: 05/03/17  8:39 PM  Result Value Ref Range   Troponin I 0.26 (HH) <0.03 ng/mL    Comment: CRITICAL VALUE NOTED. VALUE IS CONSISTENT WITH PREVIOUSLY REPORTED/CALLED VALUE JJB Performed at [facility], [address] [place], [place] [address]   Glucose, capillary     Status: Abnormal   Collection Time: 05/04/17 12:33 AM  Result Value Ref Range   Glucose-Capillary 125 (H) 65 - 99 mg/dL  Troponin I (q 6hr x 3)     Status: Abnormal   Collection  Time: 05/04/17  2:48 AM  Result Value Ref Range   Troponin I 0.30 (HH) <0.03 ng/mL    Comment: CRITICAL VALUE NOTED. VALUE IS CONSISTENT WITH PREVIOUSLY REPORTED/CALLED VALUE ALV Performed at [facility], [address] [place], [place] [address]   Magnesium     Status: None   Collection Time: 05/04/17  2:48 AM  Result Value Ref Range   Magnesium 2.4 1.7 - 2.4 mg/dL  Comment: Performed at [facility], [address] [place], [place] [address]  Phosphorus     Status: Abnormal   Collection Time: 05/04/17  2:48 AM  Result Value Ref Range   Phosphorus 4.9 (H) 2.5 - 4.6 mg/dL    Comment: Performed at [facility], [address] [place], [place] [address]  Urinalysis, Routine w reflex microscopic     Status: Abnormal   Collection Time: 05/04/17  5:24 AM  Result Value Ref Range   Color, Urine YELLOW (A) YELLOW   APPearance CLOUDY (A) CLEAR   Specific Gravity, Urine 1.016 1.005 - 1.030   pH 6.0 5.0 - 8.0   Glucose, UA NEGATIVE NEGATIVE mg/dL   Hgb urine dipstick MODERATE (A) NEGATIVE   Bilirubin Urine NEGATIVE NEGATIVE   Ketones, ur 20 (A) NEGATIVE mg/dL   Protein, ur 30 (A) NEGATIVE mg/dL   Nitrite NEGATIVE NEGATIVE   Leukocytes, UA MODERATE (A) NEGATIVE   RBC / HPF 6-30 0 - 5 RBC/hpf   WBC, UA TOO NUMEROUS TO COUNT 0 - 5 WBC/hpf   Bacteria, UA NONE SEEN NONE SEEN   Squamous Epithelial / LPF 0-5 (A) NONE SEEN   WBC Clumps PRESENT    Mucus PRESENT    Hyaline Casts, UA PRESENT    Non Squamous Epithelial 0-5 (A) NONE SEEN    Comment: Performed at [facility], [address] [place], [place] [address]  Blood gas, arterial     Status: Abnormal (Preliminary result)   Collection Time: 05/04/17  5:39 AM  Result Value Ref Range   FIO2 0.80    Delivery systems BILEVEL POSITIVE AIRWAY PRESSURE    pH, Arterial 7.34 (L) 7.350 - 7.450   pCO2 arterial 120 (HH) 32.0 - 48.0 mmHg   pO2, Arterial 60 (L) 83.0 - 108.0 mmHg   Bicarbonate PENDING  20.0 - 28.0 mmol/L   O2 Saturation PENDING %   Patient temperature 37.0    Collection site LEFT RADIAL    Sample type PENDING    Allens test (pass/fail) ARTERIAL DRAW (A) PASS    Comment: Performed at [facility], [address] [place], [place] [address]  CBC     Status: Abnormal   Collection Time: 05/04/17  6:07 AM  Result Value Ref Range   WBC 7.6 3.6 - 11.0 K/uL   RBC 4.08 3.80 - 5.20 MIL/uL   Hemoglobin 11.1 (L) 12.0 - 16.0 g/dL    Comment: RESULT REPEATED AND VERIFIED   HCT 37.2 35.0 - 47.0 %   MCV 91.0 80.0 - 100.0 fL   MCH 27.3 26.0 - 34.0 pg   MCHC 30.0 (L) 32.0 - 36.0 g/dL   RDW 14.4 11.5 - 14.5 %   Platelets 160 150 - 440 K/uL    Comment: Performed at [facility], [address] [place], [place] [address]   No components found for: ESR, C REACTIVE PROTEIN MICRO: Recent Results (from the past 720 hour(s))  Blood Culture (routine x 2)     Status: None (Preliminary result)   Collection Time: 05/03/17 11:01 AM  Result Value Ref Range Status   Specimen Description   Final    BLOOD BLOOD RIGHT ARM Performed at [facility], [address], [place], [place] [address]    Special Requests   Final    BOTTLES DRAWN AEROBIC AND ANAEROBIC Blood Culture adequate volume   Culture  Setup Time   Final    Organism ID to follow GRAM  POSITIVE COCCI IN BOTH AEROBIC AND ANAEROBIC BOTTLES CRITICAL RESULT CALLED TO, READ BACK BY AND VERIFIED WITHCatalina Antigua [facility] AT 2025 05/04/17 SDR Performed at [facility], [address] [place], [place] [address]    Culture [facility] POSITIVE COCCI  Final   Report Status PENDING  Incomplete  Blood Culture (routine x 2)     Status: None (Preliminary result)   Collection Time: 05/03/17 11:01 AM  Result Value Ref Range Status   Specimen Description   Final    BLOOD BLOOD RIGHT ARM Performed at [facility], [place] [address], [place], [place] [address]    Special Requests   Final    BOTTLES DRAWN AEROBIC AND  ANAEROBIC Blood Culture adequate volume   Culture  Setup Time   Final    GRAM POSITIVE COCCI IN BOTH AEROBIC AND ANAEROBIC BOTTLES CRITICAL VALUE NOTED.  VALUE IS CONSISTENT WITH PREVIOUSLY REPORTED AND CALLED VALUE. Performed at [facility], [address] [place], [place] [address]    Culture [facility] POSITIVE COCCI  Final   Report Status PENDING  Incomplete  Blood Culture ID Panel (Reflexed)     Status: Abnormal   Collection Time: 05/03/17 11:01 AM  Result Value Ref Range Status   Enterococcus species NOT DETECTED NOT DETECTED Final   Listeria monocytogenes NOT DETECTED NOT DETECTED Final   Staphylococcus species DETECTED (A) NOT DETECTED Final    Comment: Methicillin (oxacillin) resistant coagulase negative staphylococcus. Possible blood culture contaminant (unless isolated from more than one blood culture draw or clinical case suggests pathogenicity). No antibiotic treatment is indicated for blood  culture contaminants. CRITICAL RESULT CALLED TO, READ BACK BY AND VERIFIED WITH:  MATT MCBANE AT 7616 05/04/17 SDR    Staphylococcus aureus NOT DETECTED NOT DETECTED Final   Methicillin resistance DETECTED (A) NOT DETECTED Final    Comment: CRITICAL RESULT CALLED TO, READ BACK BY AND VERIFIED WITH:  MATT MCBANE AT 0737 05/04/17 SDR    Streptococcus species NOT DETECTED NOT DETECTED Final   Streptococcus agalactiae NOT DETECTED NOT DETECTED Final   Streptococcus pneumoniae NOT DETECTED NOT DETECTED Final   Streptococcus pyogenes NOT DETECTED NOT DETECTED Final   Acinetobacter baumannii NOT DETECTED NOT DETECTED Final   Enterobacteriaceae species NOT DETECTED NOT DETECTED Final   Enterobacter cloacae complex NOT DETECTED NOT DETECTED Final   Escherichia coli NOT DETECTED NOT DETECTED Final   Klebsiella oxytoca NOT DETECTED NOT DETECTED Final   Klebsiella pneumoniae NOT DETECTED NOT DETECTED Final   Proteus species NOT DETECTED NOT DETECTED Final   Serratia marcescens NOT  DETECTED NOT DETECTED Final   Haemophilus influenzae NOT DETECTED NOT DETECTED Final   Neisseria meningitidis NOT DETECTED NOT DETECTED Final   Pseudomonas aeruginosa NOT DETECTED NOT DETECTED Final   Candida albicans NOT DETECTED NOT DETECTED Final   Candida glabrata NOT DETECTED NOT DETECTED Final   Candida krusei NOT DETECTED NOT DETECTED Final   Candida parapsilosis NOT DETECTED NOT DETECTED Final   Candida tropicalis NOT DETECTED NOT DETECTED Final    Comment: Performed at [facility], [address] [place], [place] [address]  MRSA PCR Screening     Status: None   Collection Time: 05/03/17  4:45 PM  Result Value Ref Range Status   MRSA by PCR NEGATIVE NEGATIVE Final    Comment:        The GeneXpert MRSA Assay (FDA approved for NASAL specimens only), is one component of a comprehensive MRSA colonization surveillance program. It is  not intended to diagnose MRSA infection nor to guide or monitor treatment for MRSA infections. Performed at [facility], [address] [place], [place] [address]     IMAGING: Dg Chest Port 1 View  Result Date: 05/03/2017 CLINICAL DATA:  Shortness of breath. History of COPD, current smoker. EXAM: PORTABLE CHEST 1 VIEW COMPARISON:  Chest x-ray of November 17, 2016 FINDINGS: The lungs are well-expanded. There are chronic changes peripherally in the right mid upper hemithorax. There is prominence of the right hilar structures slightly more conspicuous than in the past. The left lung is well-expanded. There stable left apical pleural thickening. The heart and pulmonary vascularity are normal. IMPRESSION: Mild prominence of the right hilar structures more conspicuous than in the past. Stable pleural based density peripherally in the right mid upper hemithorax and in the left apex. Given the right hilar prominence, chest CT scanning would be useful in an effort to exclude occult malignancy. Electronically Signed   By: David  Martinique M.D.    On: 05/03/2017 10:32    Assessment:   Jaydynn Wolford is a 57 y.o. female with hx of COPD on 3 L O2, admitted with acute on chronic resp failure, and AMS. She is quite ill with severe COPD. No fevers, nml wbc, nml procalcitonin. CXR with no infiltrate. She has 2/2 BCX + Staph species but listed as drawn at same time from R arm so may be same draw.  Does not have a PPM or artifical endovascular devices and no skin or soft tissue infections. I suspect all contaminant.   Recommendations Given lack of evidence of infection with no fever, nml wbc, nml procalcitonin would stop abx and continue treatment for COPD exacerbation.   Thank you very much for allowing me to participate in the care of this patient. Please call with questions.   Cheral Marker. Ola Spurr, MD

## 2017-05-04 NOTE — Progress Notes (Signed)
Patient's husband refusing to answer assessment questions.  He states he will answer them tomorrow when his daughters are here.

## 2017-05-04 NOTE — Progress Notes (Signed)
Met with husband in family waiting area. DNR/DNI. Continue current interventions. Waiting for daughters to come from [place] before withdrawing care. They likely will not be here till Thursday. Husband is hopeful her CO2 level and cognitive status will improve enough to get her off BiPAP and home with hospice services. Discussed with Dr. Kasa. Added prn morphine/ativan for pain, dyspnea, air hunger, or anxiety. Full note to follow. Palliative will continue to follow.   NO CHARGE  Megan Mason, FNP-C Palliative Medicine Team  Phone: [phone] Fax: [phone]  

## 2017-05-04 NOTE — Consult Note (Signed)
Consultation Note Date: 05/04/17  Patient Name: Patty Coleman  DOB: March 15, 1961  MRN: [id number]  Age / Sex: 57 y.o., female  PCP: Patty College, NP Referring Physician: Epifanio Lesches, MD  Reason for Consultation: Establishing goals of care  HPI/Patient Profile: 57 y.o. 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 Oxygen saturations at home 40%. In ED, patient with unresponsiveness, tachycardia, tachypnea, and hypoxia. PCO2 120 and placed on BiPAP. Admitted to ICU with coma secondary to hypercarbic/hypoxic respiratory failure. Previous hospice patient. Palliative medicine consultation for goals of care.    Clinical Assessment and Goals of Care: I have reviewed medical records, discussed with care team, and met with husband Patty Coleman) in family waiting room to discuss diagnosis, prognosis, GOC, EOL wishes, disposition and options. This afternoon, patient is awake on BiPAP but remains confused.   Patient known to PMT from previous admission. Notes reviewed. Patient was sent home with hospice services in August 2018.   Introduced Palliative Medicine as specialized medical care for people living with serious illness. It focuses on providing relief from the symptoms and stress of a serious illness. The goal is to improve quality of life for both the patient and the family.  Patty Coleman tells me she was doing well at home with hospice services and the aids/nurses gave her much joy during their visits. She was improving therefore discharged from hospice services at the end of November. Patty Coleman tells me she has continued to "chain smoke" but he doesn't feel it is his place to "take her decisions away from her" (as he has told me in the past).   Discussed  hospital diagnoses and interventions. Discussed guarded prognosis due to chronic respiratory failure secondary to end-stage COPD.   Patty Coleman tells me she "wouldn't be alive still" if she had remained with hospice. He would have wanted her to stay home and comfortable. He struggles with the thought that she is again hospitalized and this is not her wish. She does not want to "die in an ICU." Patty Coleman is still traumatized from being intubated last hospitalization when this was not her wish. But since she is here, he wants to give her twin daughters the opportunity to visit and say goodbyes. They live in [place] and unable to leave till Thursday.   Today, patient is awake and alert. Patty Coleman is hopeful that by continuing BiPAP her CO2 level will decrease and cognitive status will continue to improve. He is waiting for twin daughters to arrive before withdrawing care. Patty Coleman is hopeful to get her home with hospice services. I prepared him for what to expect if she cannot sustain respirations or comfort level off BiPAP and at this point needing inpatient hospice for symptom management. Patty Coleman would be ok with inpatient hospice also.   Discussed adding prn morphine/ativan for symptom management. Patty Coleman speaks of prn morphine at home greatly helping with dyspnea. He states "I'm not ready for  her to go" but also "do not want her to suffer."   Questions and concerns were addressed. Emotional support provided.   SUMMARY OF RECOMMENDATIONS    DNR/DNI  Continue current interventions including BiPAP. Waiting for daughters to arrive from [place]. May not be till Thursday.   Husband hopeful to get her home with hospice. I also tried to prepare him that she may not tolerate respirations off BIPAP, therefore needing withdrawal of care/hospice facility.   Symptom management--see below. Discussed with Dr. Mortimer Fries.  PMT will continue to support patient/family through hospitalization.   Code Status/Advance Care  Planning:  DNR  Symptom Management:   Morphine 3m IV q1h prn pain/dyspnea/air hunger  Ativan 0.532mIV q4h prn anxiety  Palliative Prophylaxis:   Aspiration, Delirium Protocol, Frequent Pain Assessment, Oral Care and Turn Reposition  Additional Recommendations (Limitations, Scope, Preferences):  DNR/DNI. Continue BiPAP. PRN meds for comfort.  Psycho-social/Spiritual:   Desire for further Chaplaincy support: yes  Additional Recommendations: Caregiving  Support/Resources and Education on Hospice  Prognosis:   Unable to determine guarded. Hospital death versus inpatient hospice  Discharge Planning: To Be Determined      Primary Diagnoses: Present on Admission: . Acute respiratory failure ([facility]  I have reviewed the medical record, interviewed the patient and family, and examined the patient. The following aspects are pertinent.  Past Medical History:  Diagnosis Date  . Anxiety   . Cancer (HCC)    cervical stage I  . COPD (chronic obstructive pulmonary disease) ([facility]  . Hypercholesteremia    previously on medications  . Urinary incontinence    Social History   Socioeconomic History  . Marital status: Married    Spouse name: None  . Number of children: None  . Years of education: None  . Highest education level: None  Social Needs  . Financial resource strain: None  . Food insecurity - worry: None  . Food insecurity - inability: None  . Transportation needs - medical: None  . Transportation needs - non-medical: None  Occupational History  . None  Tobacco Use  . Smoking status: Current Every Day Smoker    Packs/day: 1.25    Years: 35.00    Pack years: 43.75    Types: Cigarettes  . Smokeless tobacco: Current User  . Tobacco comment: would like to quit  Substance and Sexual Activity  . Alcohol use: No  . Drug use: No  . Sexual activity: None  Other Topics Concern  . None  Social History Narrative  . None   Family History  Problem Relation  Age of Onset  . COPD Mother   . Heart disease Father        heart attack and defibrillator  . Alzheimer's disease Father   . Irritable bowel syndrome Brother    Scheduled Meds: . budesonide  0.5 mg Nebulization BID  . chlorhexidine  15 mL Mouth Rinse BID  . heparin  5,000 Units Subcutaneous Q8H  . ipratropium-albuterol  3 mL Nebulization Q4H  . mouth rinse  15 mL Mouth Rinse q12n4p  . methylPREDNISolone (SOLU-MEDROL) injection  40 mg Intravenous Q12H  . nicotine  14 mg Transdermal Daily  . sodium chloride flush  3 mL Intravenous Q12H   Continuous Infusions: . famotidine (PEPCID) IV Stopped (05/04/17 1218)   PRN Meds:.acetaminophen **OR** acetaminophen, bisacodyl, ondansetron **OR** ondansetron (ZOFRAN) IV Medications Prior to Admission:  Prior to Admission medications   Medication Sig Start Date End Date Taking? Authorizing Provider  budesonide (PULMICORT) 0.5  MG/2ML nebulizer solution VVN BID 03/09/17   [provider]  ipratropium-albuterol (DUONEB) 0.5-2.5 (3) MG/3ML SOLN Take 3 mLs by nebulization every 4 (four) hours as needed (shortness of breath).    [provider]   Allergies  Allergen Reactions  . Sulfa Antibiotics Swelling    angioedema  . Eggs Or Egg-Derived Products Hives and Nausea Only  . Lincocin [Lincomycin Hcl] Hives   Review of Systems  Unable to perform ROS: Acuity of condition   Physical Exam  Constitutional: She appears ill.  HENT:  Head: Normocephalic and atraumatic.  Cardiovascular: Regular rhythm.  Pulmonary/Chest: She has decreased breath sounds.  Currently on BiPAP  Neurological: She is alert.  confused  Skin: Skin is warm and dry. There is pallor.  Psychiatric: Her speech is delayed. Cognition and memory are impaired. She is inattentive.  Nursing note and vitals reviewed.  Vital Signs: BP (!) 159/69   Pulse 99   Temp 98.2 F (36.8 C) (Axillary)   Resp (!) 44   Ht _0  (1.626 m)   Wt 62.2 kg (137 lb 2 oz)   SpO2  90%   BMI 23.54 kg/m  Pain Assessment: CPOT     SpO2: SpO2: 90 % O2 Device:SpO2: 90 % O2 Flow Rate: .O2 Flow Rate (L/min): (Bipap)  IO: Intake/output summary:   Intake/Output Summary (Last 24 hours) at 05/04/2017 1458 Last data filed at 05/04/2017 1153 Gross per 24 hour  Intake 250 ml  Output 800 ml  Net -550 ml    LBM:   Baseline Weight: Weight: 66.7 kg (147 lb) Most recent weight: Weight: 62.2 kg (137 lb 2 oz)     Palliative Assessment/Data: PPS 30% %  Flowsheet Rows     Most Recent Value  Intake Tab  Referral Department  Critical care  Unit at Time of Referral  ICU  Palliative Care Primary Diagnosis  Pulmonary  Palliative Care Type  Return patient Palliative Care  Reason for referral  Clarify Goals of Care  Date first seen by Palliative Care  05/04/17  Clinical Assessment  Palliative Performance Scale Score  30%  Psychosocial & Spiritual Assessment  Palliative Care Outcomes  Patient/Family meeting held?  Yes  Who was at the meeting?  husband  Palliative Care Outcomes  Clarified goals of care, Provided end of life care assistance, Provided psychosocial or spiritual support, Improved pain interventions, Improved non-pain symptom therapy, Counseled regarding hospice, ACP counseling assistance      Time In: 1345 Time Out: 1500 Time Total: 38mn Greater than 50%  of this time was spent counseling and coordinating care related to the above assessment and plan.  Signed by:  MIhor Dow FNP-C Palliative Medicine Team  Phone: [phone]: [phone]  Please contact Palliative Medicine Team phone at [phone] questions and concerns.  For individual provider: See AShea Evans

## 2017-05-04 NOTE — Progress Notes (Signed)
PHARMACY - PHYSICIAN COMMUNICATION CRITICAL VALUE ALERT - BLOOD CULTURE IDENTIFICATION (BCID)  Patty Coleman is an 57 y.o. female who presented to [facility] on 05/03/2017 with a chief complaint of   Assessment:  BCID 3/4 Staph spp mecA(+) (include suspected source if known)  Name of physician (or Provider) Contacted: Tukov  Current antibiotics: none  Changes to prescribed antibiotics recommended:  Will give vancomycin 1 gram x 1 dose. CCM NP will confer with MD at rounding and determine if further abx are warranted.  Results for orders placed or performed during the hospital encounter of 11/17/16  Blood Culture ID Panel (Reflexed) (Collected: 11/17/2016  2:18 PM)  Result Value Ref Range   Enterococcus species NOT DETECTED NOT DETECTED   Listeria monocytogenes NOT DETECTED NOT DETECTED   Staphylococcus species DETECTED (A) NOT DETECTED   Staphylococcus aureus NOT DETECTED NOT DETECTED   Methicillin resistance DETECTED (A) NOT DETECTED   Streptococcus species NOT DETECTED NOT DETECTED   Streptococcus agalactiae NOT DETECTED NOT DETECTED   Streptococcus pneumoniae NOT DETECTED NOT DETECTED   Streptococcus pyogenes NOT DETECTED NOT DETECTED   Acinetobacter baumannii NOT DETECTED NOT DETECTED   Enterobacteriaceae species NOT DETECTED NOT DETECTED   Enterobacter cloacae complex NOT DETECTED NOT DETECTED   Escherichia coli NOT DETECTED NOT DETECTED   Klebsiella oxytoca NOT DETECTED NOT DETECTED   Klebsiella pneumoniae NOT DETECTED NOT DETECTED   Proteus species NOT DETECTED NOT DETECTED   Serratia marcescens NOT DETECTED NOT DETECTED   Haemophilus influenzae NOT DETECTED NOT DETECTED   Neisseria meningitidis NOT DETECTED NOT DETECTED   Pseudomonas aeruginosa NOT DETECTED NOT DETECTED   Candida albicans NOT DETECTED NOT DETECTED   Candida glabrata NOT DETECTED NOT DETECTED   Candida krusei NOT DETECTED NOT DETECTED   Candida parapsilosis NOT DETECTED NOT DETECTED   Candida  tropicalis NOT DETECTED NOT DETECTED    Arlis Yale S 05/04/2017  6:57 AM

## 2017-05-04 NOTE — Progress Notes (Signed)
Initial Nutrition Assessment  DOCUMENTATION CODES:   Not applicable  INTERVENTION:  No appropriate nutrition intervention at this time as patient is NPO. Will monitor goals of care and make nutrition recommendations as appropriate.  NUTRITION DIAGNOSIS:   Inadequate oral intake related to inability to eat as evidenced by NPO status.  GOAL:   Patient will meet greater than or equal to 90% of their needs  MONITOR:   Diet advancement, Labs, Weight trends, Skin, I & O's  REASON FOR ASSESSMENT:   Low Braden    ASSESSMENT:   56 year old female with PMHx of COPD, hx stage I cervical cancer s/p abdominal hysterectomy, hypercholesteremia, anxiety who was admitted after being found unresponsive and hypothermic at home found to have progressive metabolic encephalopathy from severe hypercapnia and severe hypoxia requiring BiPAP.   -Patient was made DNR/DNI. Plan is for palliative care consult and assessment for appropriateness of discharging home with hospice when ready.  Met with patient at bedside. She was very short of breath on BiPAP and unable to provide any history. No family at bedside.   Per chart she was 152.7 lbs on 11/23/2016 and has lost 15.6 lbs (10.2% body weight) over 5 months, which is significant for time frame.  Medications reviewed and include: Solu-Medrol 40 mg Q12hrs IV, famotidine.  Labs reviewed: CBG 125, Phosphorus 4.9, Chloride 81, CO2 48, BUN 29.  Patient is at risk for malnutrition.  Discussed with RN and on rounds. Patient will remain NPO until able to come off BiPAP and alert enough for PO intake.  NUTRITION - FOCUSED PHYSICAL EXAM:    Most Recent Value  Orbital Region  No depletion  Upper Arm Region  Mild depletion  Thoracic and Lumbar Region  No depletion  Buccal Region  No depletion  Temple Region  Mild depletion  Clavicle Bone Region  Mild depletion  Clavicle and Acromion Bone Region  Mild depletion  Scapular Bone Region  Unable to assess   Dorsal Hand  No depletion  Patellar Region  Unable to assess  Anterior Thigh Region  Unable to assess  Posterior Calf Region  Unable to assess  Edema (RD Assessment)  None  Hair  Reviewed  Eyes  Unable to assess  Mouth  Unable to assess  Skin  Reviewed  Nails  Reviewed     Diet Order:  Diet NPO time specified  EDUCATION NEEDS:   Not appropriate for education at this time  Skin:  Skin Assessment: Reviewed RN Assessment(b/l feet mottled)  Last BM:  Unknown  Height:   Ht Readings from Last 1 Encounters:  05/03/17 5' 4" (1.626 m)    Weight:   Wt Readings from Last 1 Encounters:  05/03/17 137 lb 2 oz (62.2 kg)    Ideal Body Weight:  54.5 kg  BMI:  Body mass index is 23.54 kg/m.  Estimated Nutritional Needs:   Kcal:  1575-1820 (MSJ x 1.3-1.5)  Protein:  75-90 grams (1.2-1.4 grams/kg)  Fluid:  1.8 L/day (30 mL/kg)  Leanne Stephens, MS, RD, LDN Office: [phone] Pager: [phone] After Hours/Weekend Pager: [phone]  

## 2017-05-04 NOTE — Progress Notes (Signed)
Chaplain followed up from ED. Husband did not wish to see Chaplain. Chaplain departed   05/04/17 1400  Clinical Encounter Type  Visited With Family  Visit Type Follow-up  Spiritual Encounters  Spiritual Needs Emotional

## 2017-05-05 ENCOUNTER — Inpatient Hospital Stay: Payer: Medicaid Other

## 2017-05-05 DIAGNOSIS — R0600 Dyspnea, unspecified: Secondary | ICD-10-CM

## 2017-05-05 DIAGNOSIS — Z515 Encounter for palliative care: Secondary | ICD-10-CM

## 2017-05-05 DIAGNOSIS — J9602 Acute respiratory failure with hypercapnia: Secondary | ICD-10-CM

## 2017-05-05 DIAGNOSIS — F411 Generalized anxiety disorder: Secondary | ICD-10-CM

## 2017-05-05 DIAGNOSIS — J9601 Acute respiratory failure with hypoxia: Secondary | ICD-10-CM

## 2017-05-05 LAB — BLOOD GAS, ARTERIAL
ACID-BASE EXCESS: 37.8 mmol/L — AB (ref 0.0–2.0)
BICARBONATE: 67.7 mmol/L — AB (ref 20.0–28.0)
DELIVERY SYSTEMS: POSITIVE
EXPIRATORY PAP: 5
FIO2: 0.4
Inspiratory PAP: 20
Mechanical Rate: 8
O2 Saturation: 94.8 %
PH ART: 7.47 — AB (ref 7.350–7.450)
Patient temperature: 37
pCO2 arterial: 93 mmHg (ref 32.0–48.0)
pO2, Arterial: 70 mmHg — ABNORMAL LOW (ref 83.0–108.0)

## 2017-05-05 LAB — URINE CULTURE: Culture: <10000 — AB

## 2017-05-05 MED ORDER — LORAZEPAM 2 MG/ML IJ SOLN
0.5000 mg | INTRAMUSCULAR | Status: DC | PRN
  Administered 2017-05-05 (×2): 0.5 mg via INTRAVENOUS
  Filled 2017-05-05 (×2): qty 1

## 2017-05-05 NOTE — Progress Notes (Signed)
Removed BIPAP mask and placed patient on 6 lpm [place].  Patient unable to tolerate. Respirations increased to mid 50's. Patient placed back on BIPAP. RN made aware.

## 2017-05-05 NOTE — Progress Notes (Signed)
[facility] [place] NAME: Patty Coleman    MR#:  [id number]  DATE OF BIRTH:  [birth year]  SUBJECTIVE: Patient is here for metabolic encephalopathy with acute on chronic hypoxic and hypercapnic respiratory failure, still on BiPAP unable to wean off the BiPAP.  Seen by palliative care, on morphine and right now she is sedated and not able to give any complaints.  CHIEF COMPLAINT:   Chief Complaint  Patient presents with  . Shortness of Breath    REVIEW OF SYSTEMS:   Review of Systems  Unable to perform ROS: Critical illness     DRUG ALLERGIES:   Allergies  Allergen Reactions  . Sulfa Antibiotics Swelling    angioedema  . Eggs Or Egg-Derived Products Hives and Nausea Only  . Lincocin [Lincomycin Hcl] Hives    VITALS:  Blood pressure 138/65, pulse 98, temperature 98.5 F (36.9 C), temperature source Axillary, resp. rate (!) 33, height 5\' 4"  (1.626 m), weight 62.2 kg (137 lb 2 oz), SpO2 93 %.  PHYSICAL EXAMINATION:  GENERAL:  57 y.o.-year-old patient lying in the bed with no acute distress.  BiPAP mask present on the nose and mouth EYES: Pupils equal, round, reactive to light and accommodation. No scleral icterus. Extraocular muscles intact.  HEENT: Head atraumatic, normocephalic. Oropharynx and nasopharynx clear.  NECK:  Supple, no jugular venous distention. No thyroid enlargement, no tenderness.  LUNGS: Decreased breath sounds bilaterally.  Rales,rhonchi or crepitation. No use of accessory muscles of respiration.  CARDIOVASCULAR: S1, S2 normal. No murmurs, rubs, or gallops.  ABDOMEN: Soft, nontender, nondistended. Bowel sounds present. No organomegaly or mass.  EXTREMITIES: No pedal edema, cyanosis, or clubbing.  NEUROLOGIC: Unable to do full neurologic exam because she is on BiPAP mask.  But able to move all extremities.  PSYCHIATRIC: The patient is alert and oriented x 3.  SKIN: No obvious rash, lesion, or  ulcer.    LABORATORY PANEL:   CBC Recent Labs  Lab 05/04/17 0607  WBC 7.6  HGB 11.1*  HCT 37.2  PLT 160   ------------------------------------------------------------------------------------------------------------------  Chemistries  Recent Labs  Lab 05/03/17 1100 05/04/17 0248  NA 141  --   K 4.4  --   CL 81*  --   CO2 48*  --   GLUCOSE 118*  --   BUN 29*  --   CREATININE 0.62  --   CALCIUM 9.2  --   MG  --  2.4  AST 27  --   ALT 10*  --   ALKPHOS 64  --   BILITOT 0.8  --    ------------------------------------------------------------------------------------------------------------------  Cardiac Enzymes Recent Labs  Lab 05/04/17 0248  TROPONINI 0.30*   ------------------------------------------------------------------------------------------------------------------  RADIOLOGY:  Dg Chest Port 1 View  Result Date: 05/05/2017 CLINICAL DATA:  Acute respiratory failure EXAM: PORTABLE CHEST 1 VIEW COMPARISON:  05/03/2016 FINDINGS: Cardiac shadow is stable. The lungs are hyperaerated bilaterally but stable. Persistent right pleural based density and right hilar prominence is noted stable from the recent exam. No new focal infiltrate or sizable effusion is seen. No bony abnormality is noted. IMPRESSION: Chronic appearing changes along the right chest wall. Persistent right hilar prominence stable from the recent exam. No new focal abnormality is noted. Electronically Signed   By: Inez Catalina M.D.   On: 05/05/2017 07:15    EKG:   Orders placed or performed during the hospital encounter of 05/03/17  . ED EKG  . ED EKG  .  EKG 12-Lead  . EKG 12-Lead    ASSESSMENT AND PLAN:  Acute on chronic respiratory failure with hypoxia, hypercapnia: Continue BiPAP, patient DNR.,  Patient on BiPAP, unable to wean off BiPAP, prognosis really poor, patient has profound respiratory acidosis in the emergency room, husband is agreeable for palliative care, hospice.  Followed by  hospice previously.   #2 end-stage COPD with hypercapnia and hypoxia: Continue IV steroids, bronchodilators, BiPAP, stop antibiotics as there is no evidence of infection.  4.  Prognosis poor CODE STATUS DNR   All the records are reviewed and case discussed with Care Management/Social Workerr. Management plans discussed with the patient, family and they are in agreement.  CODE STATUS: DNR  TOTAL TIME TAKING CARE OF THIS PATIENT: 35 minutes.    Epifanio Lesches M.D on 05/05/2017 at 1:37 PM  Between 7am to 6pm - Pager - [phone]  After 6pm go to [email or web address] - password [email or web address] [facility]  [facility]  Office  [phone]  CC: Primary care physician; Mikey College, NP   Note: This dictation was prepared with Dragon dictation along with smaller phrase technology. Any transcriptional errors that result from this process are unintentional.

## 2017-05-05 NOTE — Progress Notes (Addendum)
Daily Progress Note   Patient Name: Patty Coleman       Date: 05/06/2017 DOB: 02-01-1961  Age: 57 y.o. MRN#: [id number] Attending Physician: Gorden Harms, MD Primary Care Physician: Mikey College, NP Admit Date: 05/03/2017  Reason for Consultation/Follow-up: Establishing goals of care and Hospice Evaluation  Subjective: Patient awake, alert, oriented. Off BiPAP and with stable respiratory status. She is asking for toast. Drinking coffee that daughters brought her with no s/s of aspiration.   GOC:  Husband and two daughters Patty Coleman and Patty Coleman) at bedside. Discussed course of hospitalization with patient. Also discussed discharge plan with patient and family. Since the patient is stable off of BiPAP, she will not need to discharge with morphine infusion. Educated on medications as needed for pain/dyspnea/anxiety. Patient states relief of SOB with Roxanol at home with hospice services. She feels the ativan is helping better than Xanax. We also discussed adding low dose fentanyl patch for dyspnea. Patient and family agreeable.   Patient and family requesting same hospice agency as before ([facility]). Husband is adamant to have home BiPAP as needed HS. The patient nods her head no and likely will not wear this at home. Answered questions and concerns. PMT contact information given.   Length of Stay: 3  Current Medications: Scheduled Meds:  . budesonide  0.5 mg Nebulization BID  . chlorhexidine  15 mL Mouth Rinse BID  . fentaNYL  12.5 mcg Transdermal Q72H  . heparin  5,000 Units Subcutaneous Q8H  . ipratropium-albuterol  3 mL Nebulization Q4H  . mouth rinse  15 mL Mouth Rinse q12n4p  . methylPREDNISolone (SOLU-MEDROL) injection  40 mg Intravenous Q12H  . nicotine   14 mg Transdermal Daily  . sodium chloride flush  3 mL Intravenous Q12H    Continuous Infusions: . famotidine (PEPCID) IV Stopped (05/06/17 1100)    PRN Meds: acetaminophen **OR** acetaminophen, bisacodyl, LORazepam, LORazepam, morphine injection, morphine CONCENTRATE, ondansetron **OR** ondansetron (ZOFRAN) IV  Physical Exam  Constitutional: She is oriented to person, place, and time. She is cooperative. She appears ill.  HENT:  Head: Normocephalic and atraumatic.  Cardiovascular: Regular rhythm.  Pulmonary/Chest: No accessory muscle usage. No tachypnea. No respiratory distress. She has decreased breath sounds.  Off BiPAP. On 3L  Abdominal: Normal appearance.  Neurological: She is alert and oriented to person,  place, and time.  Skin: Skin is warm and dry. There is pallor.  Psychiatric: She has a normal mood and affect. Her speech is normal and behavior is normal.  Nursing note and vitals reviewed.          Vital Signs: BP (!) 136/56   Pulse 89   Temp 98.5 F (36.9 C) (Oral)   Resp (!) 33   Ht 5\' 4"  (1.626 m)   Wt 62.2 kg (137 lb 2 oz)   SpO2 95%   BMI 23.54 kg/m  SpO2: SpO2: 95 % O2 Device: O2 Device: Nasal Cannula O2 Flow Rate: O2 Flow Rate (L/min): 3 L/min  Intake/output summary:   Intake/Output Summary (Last 24 hours) at 05/06/2017 1210 Last data filed at 05/06/2017 0500 Gross per 24 hour  Intake 50 ml  Output 1075 ml  Net -1025 ml   LBM:   Baseline Weight: Weight: 66.7 kg (147 lb) Most recent weight: Weight: 62.2 kg (137 lb 2 oz)       Palliative Assessment/Data: PPS 40%   Flowsheet Rows     Most Recent Value  Intake Tab  Referral Department  Critical care  Unit at Time of Referral  ICU  Palliative Care Primary Diagnosis  Pulmonary  Date Notified  05/04/17  Palliative Care Type  Return patient Palliative Care  Reason for referral  Clarify Goals of Care  Date of Admission  05/03/17  Date first seen by Palliative Care  05/04/17  # of days Palliative  referral response time  0 Day(s)  # of days IP prior to Palliative referral  1  Clinical Assessment  Palliative Performance Scale Score  40%  Psychosocial & Spiritual Assessment  Palliative Care Outcomes  Patient/Family meeting held?  Yes  Who was at the meeting?  husband, daughters, patient  Palliative Care Outcomes  Improved pain interventions, Improved non-pain symptom therapy, Clarified goals of care, Counseled regarding hospice, Provided end of life care assistance, Provided psychosocial or spiritual support      Patient Active Problem List   Diagnosis Date Noted  . Acute respiratory failure with hypoxia and hypercarbia (HCC)   . Palliative care by specialist   . Terminal care   . Insomnia due to medical condition 03/23/2017  . Chronic bilateral low back pain without sciatica 03/23/2017  . Dyspnea   . Acute respiratory failure ([place]) 11/17/2016  . Goals of care, counseling/discussion   . DNR (do not resuscitate)   . Tobacco use 11/02/2012  . Anxiety state 02/25/2012  . Hypercholesterolemia 10/07/2011  . COPD exacerbation ([place]) 08/28/2011  . Osteoarthritis 08/28/2011    Palliative Care Assessment & Plan   Patient Profile: 57 y.o. 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 Oxygen saturations at home 40%. In ED, patient with unresponsiveness, tachycardia, tachypnea, and hypoxia. PCO2 120 and placed on BiPAP. Admitted to ICU with coma secondary to hypercarbic/hypoxic respiratory failure. Previous hospice patient. Palliative medicine consultation for goals of care.    Assessment: Acute on chronic respiratory failure with hypoxia and hypercarbia End-stage COPD Metabolic encephalopathy  Recommendations/Plan:  DNR/DNI  Stable for now off BiPAP. Plan is for discharge home tomorrow with hospice services. Care management notified.  May continue to use BiPAP prn.  Home BiPAP HS prn.  Regular diet placed.   Symptom management  Roxanol 10mg  SL q2h prn pain/dyspnea/air hunger  Ativan 0.5mg  PO q4h prn anxiety/sleep  Fentanyl 12.23mcg TD q72h  PMT will continue to follow and assist with discharge.   Goals of Care and Additional Recommendations:  Limitations on Scope of Treatment: DNR/DNI. Continue BiPAP prn. Medications as needed for comfort.   Code Status: DNR/DNI   Code Status Orders  (From admission, onward)        Start     Ordered   05/03/17 1455  Do not attempt resuscitation (DNR)  Continuous    Question Answer Comment  In the event of cardiac or respiratory ARREST Do not call a "code blue"   In the event of cardiac or respiratory ARREST Do not perform Intubation, CPR, defibrillation or ACLS   In the event of cardiac or respiratory ARREST Use medication by any route, position, wound care, and other measures to relive pain and suffering. May use oxygen, suction and manual treatment of airway obstruction as needed for comfort.      05/03/17 1454    Code Status History    Date Active Date Inactive Code Status Order ID Comments User Context   11/17/2016 17:10 11/23/2016 17:33 DNR [id number]  Epifanio Lesches, MD ED   08/14/2016 17:01 11/17/2016 14:07 DNR [id number] Confirmed DNR.  Patient and husband verbalize understanding to withhold intubation, CPR, defibrillation, and ACLS protocol. Mikey College, NP Outpatient   03/06/2016 13:59 03/08/2016 14:31 DNR [id number]  Basilio Cairo, NP Inpatient   03/05/2016 01:36 03/06/2016 13:59 Full Code [id number]  Harvie Bridge, DO Inpatient    Advance Directive Documentation     Most Recent Value  Type of Advance Directive  Out of facility DNR (pink MOST or yellow form)  Pre-existing out of facility DNR order (yellow form or pink MOST form)  Yellow form placed in chart (order not valid for inpatient use)  "MOST" Form in Place?  No data        Prognosis:   Poor prognosis secondary to chronic respiratory failure with hypoxia/hypercarbia and end-stage COPD.   Discharge Planning:  To Be Determined  Care plan was discussed with husband, daughters, patient, RN, Dr. Mortimer Fries, hospice liaison  Thank you for allowing the Palliative Medicine Team to assist in the care of this patient.   Time In: 0910 Time Out: 0950 Total Time 53min Prolonged Time Billed  no      Greater than 50%  of this time was spent counseling and coordinating care related to the above assessment and plan.  Ihor Dow, FNP-C Palliative Medicine Team  Phone: [phone] Fax: [phone]  Please contact Palliative Medicine Team phone at [phone] for questions and concerns.

## 2017-05-05 NOTE — Progress Notes (Signed)
Pt has been resting comfortably this shift with PRN ativan. Per Dr. Mortimer Fries, okay to take Bipap off intermittently and put pt on nasal canula to give pt ice chips. Pt has been tolerating this break from bipap well on 5L [place]. Daughters arrived from [place] and spent time visiting with pt.

## 2017-05-05 NOTE — Progress Notes (Signed)
Daughters have arrived from [place]. Met with husband and two daughters in conference room to discuss diagnoses, interventions, and guarded prognosis. Husband is now leaning towards taking her home with hospice services. Daughters are tearful and would like to spend time with mother this evening and attempt to discuss hospice facility versus home with hospice services if she has moments of lucidity. We discussed continuing prn medications for anxiety/pain/dyspnea. We also discussed starting her on morphine infusion before she is discharged from hospital. Will F/u in AM to further discuss plan with family and coordinate discharge plan with care team.  [place], [place] Palliative Medicine Team  Phone: [phone] Fax: [phone]

## 2017-05-05 NOTE — Progress Notes (Signed)
PULMONARY / CRITICAL CARE MEDICINE   Name: Patty Coleman MRN: [id number] DOB: 12-26-1960    ADMISSION DATE:  05/03/2017 CONSULTATION DATE:  05/03/2017  REFERRING MD:  Dr. Jerelyn Charles  CHIEF COMPLAINT:  Unresponsive  HISTORY OF PRESENT ILLNESS:   History obtained from chart review. 57 year-old female with PMH as below found unresponsive and dyspneic with O2 saturations in the 40s at home. Pt also hypothermic at 94 degrees. Initial VBG 7.1/120/??/48. Pt made DNR in the ED and started on bipap. CXR with right hilar prominence, negative PCT, troponin 0.11, EKG with non-specific ST-segment depressions and RAE. Admitted to ICU with coma secondary to hypercarbic hypoxic respiratory failure.   Patient with previous ICU admission for severe COPD Patient with end stage COPD  Did NOT tolerate weaning off of biPAP Increased WOB, increased RR Using accessory muscles to breathe Husband updated and notified     SOCIAL HISTORY: She  reports that she has been smoking cigarettes.  She has a 43.75 pack-year smoking history. She uses smokeless tobacco. She reports that she does not drink alcohol or use drugs.  REVIEW OF SYSTEMS:   Not done 2/2 confusion.  SUBJECTIVE:  Critically ill-appearing  lying in hospital bed on NIV.  biPAP settings TV 250, RR 15, IPAP 20 EPAP 5 fio2 at 40% Follow up ABG shows alkalosis with pH 7.47/93/70  Patient more alert and following commands. Remains confused. Speech is incoherent.   VITAL SIGNS: BP (!) 116/95 (BP Location: Right Arm)   Pulse (!) 109   Temp 99.9 F (37.7 C) (Axillary)   Resp (!) 25   Ht _0  (1.626 m)   Wt 62.2 kg (137 lb 2 oz)   SpO2 96%   BMI 23.54 kg/m      VENTILATOR SETTINGS: FiO2 (%):  [40 %] 40 %  PHYSICAL EXAMINATION: General: Critically ill-appearing, biPAP in place Neuro:  Alert, following commands, incoherent speech. GCS 12. HEENT: NCAT. PEERL  Cardiovascular: S1, S2, RRR no m/g/r. No LE edema.  Lungs:  diminished  respiratory effort. BS with some transmitted upper airway sounds Abdomen:  Soft non-distended Musculoskeletal:  No gross abnormalities Skin:  Acyanotic, no lesions.  LABS:  BMET Recent Labs  Lab 05/03/17 1100  NA 141  K 4.4  CL 81*  CO2 48*  BUN 29*  CREATININE 0.62  GLUCOSE 118*    Electrolytes Recent Labs  Lab 05/03/17 1100 05/04/17 0248  CALCIUM 9.2  --   MG  --  2.4  PHOS  --  4.9*    CBC Recent Labs  Lab 05/03/17 1100 05/04/17 0607  WBC 7.9 7.6  HGB 11.8* 11.1*  HCT 40.6 37.2  PLT 162 160    Coag's No results for input(s): APTT, INR in the last 168 hours.  Sepsis Markers Recent Labs  Lab 05/03/17 1100 05/03/17 1101 05/03/17 1507  LATICACIDVEN 1.9  --  0.8  PROCALCITON  --  <0.10  --     ABG Recent Labs  Lab 05/03/17 2029 05/04/17 0539 05/05/17 0515  PHART 7.07* 7.34* 7.47*  PCO2ART 120* 120* 93*  PO2ART 239* 60* 70*    Liver Enzymes Recent Labs  Lab 05/03/17 1100  AST 27  ALT 10*  ALKPHOS 64  BILITOT 0.8  ALBUMIN 3.5    Cardiac Enzymes Recent Labs  Lab 05/03/17 1507 05/03/17 2039 05/04/17 0248  TROPONINI 0.23* 0.26* 0.30*    Glucose Recent Labs  Lab 05/03/17 1438 05/04/17 0033  GLUCAP 146* 125*    Imaging Dg  Chest Port 1 View  Result Date: 05/05/2017 CLINICAL DATA:  Acute respiratory failure EXAM: PORTABLE CHEST 1 VIEW COMPARISON:  05/03/2016 FINDINGS: Cardiac shadow is stable. The lungs are hyperaerated bilaterally but stable. Persistent right pleural based density and right hilar prominence is noted stable from the recent exam. No new focal infiltrate or sizable effusion is seen. No bony abnormality is noted. IMPRESSION: Chronic appearing changes along the right chest wall. Persistent right hilar prominence stable from the recent exam. No new focal abnormality is noted. Electronically Signed   By: Inez Catalina M.D.   On: 05/05/2017 07:15   I have Independently reviewed images of  CXR   on  05/05/2017 Interpretation:RT hilar prominance, no acute densiity    STUDIES:  CXR with right hilar prominence   SIGNIFICANT EVENTS: 1/21 Brought to ED via EMS with dyspnea and unresponsiveness. Pt with profound respiratory acidosis. Troponin elevated, EKG with non-specific ST depression. PCT negative. 1/21 Pt made DNR/DNI 1/21 Admitted to ICU on bipap, blood gas shows worsening acidosis with ph 6.9. 1/21 Spoke with pt's husband and daughter about pt's poor prognosis. Family is enroute from [place] and would like to continue NIV until they arrive.   1/22 Mental status much improved, pt alert but not following commands. PH normalizing, PCO2 still > 120. 1/23 Pt remains alert, now following commands. PH 7.47, PCO2 93, failed BiPAP weaning-husband notified  LINES/TUBES: 20g IV in left EJ  DISCUSSION: Pt presented unresponsive with critical respiratory failure and profound respiratory acidosis. With bipap and bronchodilator treatment, mental status has improved markedly. Overall goals of care need to be clarified to guide interventions. Palliative care NP met with pt's husband yesterday. Plan for daughters to come in from [place] to discuss before withdrawing care.   ASSESSMENT / PLAN: 57 yo WF admitted to to ICU for acute and severe resp failure from severe end stage COPD with progressive metabolic encephalopathy from severe hypercapnia and severe hypoxia.  Patient is DNR/DNI as confirmed with husband at bedside    PULMONARY A: Acute respiratory failure with hypoxia and hypercarbia P:   Pt is DNR/DNI.  Wean off of NIV later today Continue bronchodilators and steroids.   CARDIOVASCULAR A:  Elevated troponin in the setting of acute critical illness P:  Unlikely a primary cardiac etiology given history.  Overall, a poor candidate for PCI Could consider medical therapy depending on goals of care  RENAL A:   Acute respiratory acidosis on chronic respiratory acidosis,  improving 7.34/120/60 P:   DNR/DNI. Will not intubate.   GASTROINTESTINAL A:   No problems P:   SUP Keep NPO  HEMATOLOGIC A:   No problems P:  DVT prophylaxis   INFECTIOUS A:   BC + for staph x2 bottles, likely contaminate per ID P:   No intervention per ID  ENDOCRINE A:   No problems   P:   none  NEUROLOGIC A:   Acute encephalopathy in the setting of significant hypercarbia/hypoxia P:   Treatment as above  After further assessment patient with severe end stage COPD with ongoing tobacco abuse previously on Hospice Patient with long standing SOB and increased WOB and poor resp effort and very poor quality of life Failed weaning off of biPAP  I recommend Palliative care consult and assess for Home with Hospice  Overall, patient remains critically ill and poor prognosis   Critical Care Time devoted to patient care services described in this note is 35 minutes.   Overall, patient is critically ill, prognosis is  guarded.  Patient with Multiorgan failure and at high risk for cardiac arrest and death.       Corrin Parker, M.D.  [facility]  Medical Director [place] Director [facility] Cardio-Pulmonary Department

## 2017-05-06 DIAGNOSIS — J441 Chronic obstructive pulmonary disease with (acute) exacerbation: Secondary | ICD-10-CM

## 2017-05-06 DIAGNOSIS — J9601 Acute respiratory failure with hypoxia: Secondary | ICD-10-CM

## 2017-05-06 LAB — CULTURE, BLOOD (ROUTINE X 2)
SPECIAL REQUESTS: ADEQUATE
Special Requests: ADEQUATE

## 2017-05-06 MED ORDER — FENTANYL 12 MCG/HR TD PT72
12.5000 ug | MEDICATED_PATCH | TRANSDERMAL | Status: DC
  Administered 2017-05-06: 12.5 ug via TRANSDERMAL
  Filled 2017-05-06: qty 1

## 2017-05-06 MED ORDER — LORAZEPAM 0.5 MG PO TABS
0.5000 mg | ORAL_TABLET | ORAL | Status: DC | PRN
  Administered 2017-05-07: 0.5 mg via ORAL
  Filled 2017-05-06: qty 1

## 2017-05-06 MED ORDER — MORPHINE SULFATE (CONCENTRATE) 10 MG/0.5ML PO SOLN: 10.0000 mg | ORAL | Status: DC | PRN

## 2017-05-06 NOTE — Progress Notes (Signed)
Pt requesting a break from the BI-PAP.  Pt asked why the "palliative care process has not started".  Pt stated that she no longer wants to wear the BI-PAP mask.  I explained to her that the communicated plan was to wear the mask on and off til Friday and pursue hospice then.  The pt stated that she does not want to wait that long.  Pt provided with mouth moisturizer and ice chips.

## 2017-05-06 NOTE — Progress Notes (Signed)
New referral for [facility] at home received from [facility] following a  Palliative Medicine consult. Patient has had hospice services in the past. Patient seen sitting up in bed, daughter Patty Coleman and Patty Coleman present at bedside. Patient seen sitting up in bed, alert and oriented x 4, able to carry on a conversation and express her needs. She currently has oxygen in place in the home. Discussed Bipap at home, patient stated she would "take it if it meant I could go home". She has been of Bipap since 5:30 am today. Her daughters felt that she may need th Bipap "when she naps or at night". Patient is hopeful for discharge home tomorrow, no DME needs that will hold up discharge.  Hospice information and contact number left with [facility]. Patient has chosen for [facility] Director Patty Coleman to be her hospice attending.m Writer obtained Bipap settings from Respiratory and will await confirmation of need. Patient information faxed to referral. Will continue to follow through final disposition. Flo Shanks RN, BSN, [place] and [facility] [place], [facility] [phone]

## 2017-05-06 NOTE — Discharge Summary (Signed)
Physician Discharge Summary  Patient ID: Patty Coleman MRN: [id number] DOB/AGE: 1960-04-19 57 y.o.  Admit date: 05/03/2017 Discharge date: 05/06/2017  Admission Diagnoses:COPD exacerbation  Discharge Diagnoses:  Active Problems:   Anxiety state   Acute respiratory failure (HCC)   Dyspnea   Acute respiratory failure with hypoxia and hypercarbia (HCC)   Palliative care by specialist   Terminal care   Discharged Condition: chronic resp failure Chronic oxygen therapy  Hospital Course:  Admitted to ICU Therapy included: aggressive biPAP therapy Iv steroids, aggressive BD therapy ABG shows progressive improvement  DNR/DNI discussion Palliative consulted Discussions for Hospice assessment and re-initiation   Discharge Exam: Blood pressure 131/60, pulse 92, temperature 98.5 F (36.9 C), temperature source Oral, resp. rate (!) 27, height 5\' 4"  (1.626 m), weight 137 lb 2 oz (62.2 kg), SpO2 95 %.  Disposition: 50-Hospice/Home   Signed: Flora Lipps 05/06/2017, 3:47 PM

## 2017-05-06 NOTE — Progress Notes (Signed)
[place] [facility] [place] NAME: Patty Coleman    MR#:  [id number]  DATE OF BIRTH:  [birth year]  SUBJECTIVE:  CHIEF COMPLAINT:   Chief Complaint  Patient presents with  . Shortness of Breath  No events overnight, plan is for home hospice in the morning, case discussed with intensivist  REVIEW OF SYSTEMS:  CONSTITUTIONAL: No fever, fatigue or weakness.  EYES: No blurred or double vision.  EARS, NOSE, AND THROAT: No tinnitus or ear pain.  RESPIRATORY: No cough, shortness of breath, wheezing or hemoptysis.  CARDIOVASCULAR: No chest pain, orthopnea, edema.  GASTROINTESTINAL: No nausea, vomiting, diarrhea or abdominal pain.  GENITOURINARY: No dysuria, hematuria.  ENDOCRINE: No polyuria, nocturia,  HEMATOLOGY: No anemia, easy bruising or bleeding SKIN: No rash or lesion. MUSCULOSKELETAL: No joint pain or arthritis.   NEUROLOGIC: No tingling, numbness, weakness.  PSYCHIATRY: No anxiety or depression.   ROS  DRUG ALLERGIES:   Allergies  Allergen Reactions  . Sulfa Antibiotics Swelling    angioedema  . Eggs Or Egg-Derived Products Hives and Nausea Only  . Lincocin [Lincomycin Hcl] Hives    VITALS:  Blood pressure 131/60, pulse 92, temperature 98.5 F (36.9 C), temperature source Oral, resp. rate (!) 27, height 5\' 4"  (1.626 m), weight 62.2 kg (137 lb 2 oz), SpO2 95 %.  PHYSICAL EXAMINATION:  GENERAL:  57 y.o.-year-old patient lying in the bed with no acute distress.  EYES: Pupils equal, round, reactive to light and accommodation. No scleral icterus. Extraocular muscles intact.  HEENT: Head atraumatic, normocephalic. Oropharynx and nasopharynx clear.  NECK:  Supple, no jugular venous distention. No thyroid enlargement, no tenderness.  LUNGS: Normal breath sounds bilaterally, no wheezing, rales,rhonchi or crepitation. No use of accessory muscles of respiration.  CARDIOVASCULAR: S1, S2 normal. No murmurs, rubs, or gallops.  ABDOMEN:  Soft, nontender, nondistended. Bowel sounds present. No organomegaly or mass.  EXTREMITIES: No pedal edema, cyanosis, or clubbing.  NEUROLOGIC: Cranial nerves II through XII are intact. Muscle strength 5/5 in all extremities. Sensation intact. Gait not checked.  PSYCHIATRIC: The patient is alert and oriented x 3.  SKIN: No obvious rash, lesion, or ulcer.   Physical Exam LABORATORY PANEL:   CBC Recent Labs  Lab 05/04/17 0607  WBC 7.6  HGB 11.1*  HCT 37.2  PLT 160   ------------------------------------------------------------------------------------------------------------------  Chemistries  Recent Labs  Lab 05/03/17 1100 05/04/17 0248  NA 141  --   K 4.4  --   CL 81*  --   CO2 48*  --   GLUCOSE 118*  --   BUN 29*  --   CREATININE 0.62  --   CALCIUM 9.2  --   MG  --  2.4  AST 27  --   ALT 10*  --   ALKPHOS 64  --   BILITOT 0.8  --    ------------------------------------------------------------------------------------------------------------------  Cardiac Enzymes Recent Labs  Lab 05/03/17 2039 05/04/17 0248  TROPONINI 0.26* 0.30*   ------------------------------------------------------------------------------------------------------------------  RADIOLOGY:  Dg Chest Port 1 View  Result Date: 05/05/2017 CLINICAL DATA:  Acute respiratory failure EXAM: PORTABLE CHEST 1 VIEW COMPARISON:  05/03/2016 FINDINGS: Cardiac shadow is stable. The lungs are hyperaerated bilaterally but stable. Persistent right pleural based density and right hilar prominence is noted stable from the recent exam. No new focal infiltrate or sizable effusion is seen. No bony abnormality is noted. IMPRESSION: Chronic appearing changes along the right chest wall. Persistent right hilar prominence stable from the recent exam. No  new focal abnormality is noted. Electronically Signed   By: Inez Catalina M.D.   On: 05/05/2017 07:15    ASSESSMENT AND PLAN:  1 Acute on chronic respiratory failure with  hypoxia, hypercapnia: Continue BiPAP, patient DNR.,  Patient on BiPAP, unable to wean off BiPAP, prognosis really poor, patient has profound respiratory acidosis in the emergency room, husband is agreeable for palliative care, hospice.  Followed by hospice previously.  2 end-stage COPD with hypercapnia and hypoxia: Continue IV steroids, bronchodilators, BiPAP, stop antibiotics as there is no evidence of infection.  Prognosis poor Disposition home with hospice on tomorrow  All the records are reviewed and case discussed with Care Management/Social Workerr. Management plans discussed with the patient, family and they are in agreement.  CODE STATUS: dnr  TOTAL TIME TAKING CARE OF THIS PATIENT: 40 minutes.     POSSIBLE D/C IN 1-2 DAYS, DEPENDING ON CLINICAL CONDITION.   Avel Peace [name] M.D on 05/06/2017   Between 7am to 6pm - Pager - [phone]  After 6pm go to [email or web address] - password [email or web address] [facility]  Office  [phone]  CC: Primary care physician; Mikey College, NP  Note: This dictation was prepared with Dragon dictation along with smaller phrase technology. Any transcriptional errors that result from this process are unintentional.

## 2017-05-06 NOTE — Progress Notes (Signed)
PULMONARY / CRITICAL CARE MEDICINE   Name: Patty Coleman MRN: [id number] DOB: 24-Nov-1960    ADMISSION DATE:  05/03/2017 CONSULTATION DATE:  05/03/2017  REFERRING MD:  Dr. Jerelyn Charles  CHIEF COMPLAINT:  Unresponsive  HISTORY OF PRESENT ILLNESS:   History obtained from chart review. 57 year-old female with PMH as below found unresponsive and dyspneic with O2 saturations in the 40s at home. Pt also hypothermic at 94 degrees. Initial VBG 7.1/120/??/48. Pt made DNR in the ED and started on bipap. CXR with right hilar prominence, negative PCT, troponin 0.11, EKG with non-specific ST-segment depressions and RAE. Admitted to ICU with coma secondary to hypercarbic hypoxic respiratory failure.   Patient with previous ICU admission for severe COPD Patient with end stage COPD  Has weaned off of bipap and currently doing well on [place]. Family has arrived. Palliative care following.   SUBJECTIVE:  Critically ill-appearing  lying in hospital bed on [place], no obvious distress Pt a&ox4 and following commands.   VITAL SIGNS: BP (!) 116/52   Pulse 74   Temp 98.5 F (36.9 C) (Oral)   Resp (!) 22   Ht 5\' 4"  (1.626 m)   Wt 62.2 kg (137 lb 2 oz)   SpO2 97%   BMI 23.54 kg/m     PHYSICAL EXAMINATION: General: ill-appearing, no acute distress Neuro:  Alert, following commands, GCS 15 HEENT: NCAT. PEERL  Cardiovascular: S1, S2, RRR no m/g/r. No LE edema.  Lungs:  diminished respiratory effort. BS with some transmitted upper airway sounds Abdomen:  Soft non-distended Musculoskeletal:  No gross abnormalities Skin:  Acyanotic, no lesions.   LABS:  BMET Recent Labs  Lab 05/03/17 1100  NA 141  K 4.4  CL 81*  CO2 48*  BUN 29*  CREATININE 0.62  GLUCOSE 118*    Electrolytes Recent Labs  Lab 05/03/17 1100 05/04/17 0248  CALCIUM 9.2  --   MG  --  2.4  PHOS  --  4.9*    CBC Recent Labs  Lab 05/03/17 1100 05/04/17 0607  WBC 7.9 7.6  HGB 11.8* 11.1*  HCT 40.6 37.2  PLT 162 160     Coag's No results for input(s): APTT, INR in the last 168 hours.  Sepsis Markers Recent Labs  Lab 05/03/17 1100 05/03/17 1101 05/03/17 1507  LATICACIDVEN 1.9  --  0.8  PROCALCITON  --  <0.10  --     ABG Recent Labs  Lab 05/03/17 2029 05/04/17 0539 05/05/17 0515  PHART 7.07* 7.34* 7.47*  PCO2ART 120* 120* 93*  PO2ART 239* 60* 70*    Liver Enzymes Recent Labs  Lab 05/03/17 1100  AST 27  ALT 10*  ALKPHOS 64  BILITOT 0.8  ALBUMIN 3.5    Cardiac Enzymes Recent Labs  Lab 05/03/17 1507 05/03/17 2039 05/04/17 0248  TROPONINI 0.23* 0.26* 0.30*    Glucose Recent Labs  Lab 05/03/17 1438 05/04/17 0033  GLUCAP 146* 125*    STUDIES:  CXR with right hilar prominence   SIGNIFICANT EVENTS: 1/21 Brought to ED via EMS with dyspnea and unresponsiveness. Pt with profound respiratory acidosis. Troponin elevated, EKG with non-specific ST depression. PCT negative. 1/21 Pt made DNR/DNI 1/21 Admitted to ICU on bipap, blood gas shows worsening acidosis with ph 6.9. 1/21 Spoke with pt's husband and daughter about pt's poor prognosis. Family is enroute from [place] and would like to continue NIV until they arrive.   1/22 Mental status much improved, pt alert but not following commands. PH normalizing, PCO2 still >  120. 1/23 Pt remains alert, now following commands. PH 7.47, PCO2 93, failed BiPAP weaning-husband notified 1/24 Pt continues to improve, now a&ox4, requesting food, off of bipap  LINES/TUBES: 20g IV in left EJ  DISCUSSION: Pt presented unresponsive with critical respiratory failure and profound respiratory acidosis. With bipap and bronchodilator treatment, mental status has improved markedly. Overall goals of care need to be clarified to guide interventions. Family has arrived, current plan is for d/c home with hospice 1/25  ASSESSMENT / PLAN: 57 yo WF admitted to to ICU for acute and severe resp failure from severe end stage COPD with progressive  metabolic encephalopathy from severe hypercapnia and severe hypoxia.  Patient is DNR/DNI as confirmed with husband at bedside. Plan d/c home with hospice services per palliative care.     PULMONARY A: Acute respiratory failure with hypoxia and hypercarbia P:   Pt is DNR/DNI.  NIV PRN Continue bronchodilators and steroids.   CARDIOVASCULAR A:  Elevated troponin in the setting of acute critical illness P:  Unlikely a primary cardiac etiology given history.   RENAL A:   Acute respiratory acidosis (resolved) on chronic respiratory acidosis P:   DNR/DNI. Will not intubate.   GASTROINTESTINAL A:   No problems P:   SUP Advance diet as tolerated  HEMATOLOGIC A:   No problems P:  DVT prophylaxis   INFECTIOUS A:   BC + for staph x2 bottles, likely contaminate per ID P:   No intervention per ID  NEUROLOGIC A:   Acute encephalopathy in the setting of significant hypercarbia/hypoxia P:   Treatment as above  Plan for dishcharge home with hospice  Corrin Parker, M.D.  [facility]  Medical Director [place] Director [place] Department

## 2017-05-06 NOTE — Progress Notes (Signed)
   05/06/17 1100  Clinical Encounter Type  Visited With Patient and family together  Visit Type Follow-up  Spiritual Encounters  Spiritual Needs Emotional   Chaplain met with patient, daughters present. Conversation surrounding support system and patient's hopes for the future.  Patient looking forward to being at home.  Chaplain to hold patient in prayer.

## 2017-05-06 NOTE — Care Management Note (Signed)
Case Management Note  Patient Details  Name: Patty Coleman MRN: [id number] Date of Birth: [birth year]  Subjective/Objective:  Spoke with spouse, Claretta Fraise to offer choice of hospice agencies. He prefers [facility]. She has been a patient with this hospice before. Referral to Santiago Glad with [facility] made.  She will arrange all requested DME.                 Action/Plan:  Home with [facility].   Expected Discharge Date:                  Expected Discharge Plan:  Home w Hospice Care  In-House Referral:     Discharge planning Services  CM Consult  Post Acute Care Choice:  Hospice Choice offered to:  Spouse  DME Arranged:    DME Agency:     HH Arranged:    [place] Agency:  [facility]  Status of Service:  In process, will continue to follow  If discussed at Long Length of Stay Meetings, dates discussed:    Additional Comments:  Jolly Mango, RN 05/06/2017, 11:34 AM

## 2017-05-06 NOTE — Progress Notes (Signed)
Pt confused about hospice placement.  She also seems to have some baseline confusion during conversation.  She continues to refuse the BI-PAP mask.  Her bed is currently in the chair position.

## 2017-05-07 MED ORDER — FENTANYL 12 MCG/HR TD PT72: 12.5000 ug | MEDICATED_PATCH | TRANSDERMAL | 0 refills | Status: AC

## 2017-05-07 MED ORDER — LORAZEPAM 0.5 MG PO TABS: 0.5000 mg | ORAL_TABLET | ORAL | 0 refills | Status: AC | PRN

## 2017-05-07 MED ORDER — IPRATROPIUM-ALBUTEROL 0.5-2.5 (3) MG/3ML IN SOLN: 3.0000 mL | Freq: Two times a day (BID) | RESPIRATORY_TRACT | Status: DC

## 2017-05-07 MED ORDER — ONDANSETRON HCL 4 MG PO TABS: 4.0000 mg | ORAL_TABLET | Freq: Four times a day (QID) | ORAL | 0 refills | Status: AC | PRN

## 2017-05-07 MED ORDER — MORPHINE SULFATE (CONCENTRATE) 10 MG/0.5ML PO SOLN: 10.0000 mg | ORAL | 0 refills | Status: AC | PRN

## 2017-05-07 MED ORDER — IPRATROPIUM-ALBUTEROL 0.5-2.5 (3) MG/3ML IN SOLN: 3.0000 mL | Freq: Four times a day (QID) | RESPIRATORY_TRACT | 1 refills | Status: AC | PRN

## 2017-05-07 MED ORDER — NICOTINE 14 MG/24HR TD PT24: 14.0000 mg | MEDICATED_PATCH | Freq: Every day | TRANSDERMAL | 0 refills | Status: AC

## 2017-05-07 NOTE — Care Management (Signed)
EMS packet completed with DNR form complete. [facility] will start care with patient. No other RNCM needs.

## 2017-05-07 NOTE — Progress Notes (Signed)
Follow up visit made. Patient seen sitting up in bed, daughters present. Patient ready to go home. Writer spoke earlier to NP [facility] regarding prescriptions needed at discharge. Signed DNR in place and will accompany patient home.  Writer has requested that patient/family contact hospice when she gets home. BIPAP mask and tubing to accompany patient home. Staff RN's Clarene Critchley, family and patient all aware. Discharge summary faxed to referral. Thank you. Flo Shanks RN, BSN, [place] and [facility] liaison [phone]

## 2017-05-07 NOTE — Progress Notes (Signed)
Pt to be D/C'd Home per MD. Discussed with the patient and all questions fully answered.   VSS, skin clean, dry and intact without evidence of skin break down, no evidence of skin tears noted.  IV catheters discontinued intact. Site without signs and symptoms of complications. Dressing and pressure applied.   An after visit summary was printed and given to the patient family. Patient received prescriptions.   D/c education completed with patient/family including follow up instructions, medications list, d/c activities limitations if indicated, with other d/c instructions as indicated by MD- patient able to verbalize understanding, all questions fully answered.   Patient instructed to return to ED, call 911, or Call MD for any changes in condition.  Patient escorted via stretcher and D/C home via EMS.  Both daughters present during transport and are taking pt belongings including bipap hose, mask and prescriptions per hospice orders.

## 2017-05-07 NOTE — Progress Notes (Signed)
Patient ID: Patty Coleman, female   DOB: [birth year], 57 y.o.   MRN: [id number]   Pulmonary/critical care attending  No significant difficulties overnight. Patient has been weaned to nasal cannula oxygen. Has been set up for home discharge today with hospice. Per nursing staff patient is set up for home oxygen, will be receiving BiPAP materials this afternoon. Family is here to pick her up. All of her prescriptions have been printed and given to patient and discharge summary per Dr. Mortimer Fries has been placed.  Hermelinda Dellen, D.O.

## 2017-05-07 NOTE — Plan of Care (Signed)
Pt on 3L [place] overnight with no complaints or s/s of respiratory distress.  Pt concerned about going home with the Bi-PAP machine without any "official training."  She is enthusiastic about being able to return home with her family,

## 2017-05-07 NOTE — Progress Notes (Signed)
[facility] NAME: Patty Coleman    MR#:  [id number]  DATE OF BIRTH:  05-Dec-1960  SUBJECTIVE:  CHIEF COMPLAINT:   Chief Complaint  Patient presents with  . Shortness of Breath  Plans for home with hospice, wean to nasal cannula  REVIEW OF SYSTEMS:  CONSTITUTIONAL: No fever, fatigue or weakness.  EYES: No blurred or double vision.  EARS, NOSE, AND THROAT: No tinnitus or ear pain.  RESPIRATORY: No cough, shortness of breath, wheezing or hemoptysis.  CARDIOVASCULAR: No chest pain, orthopnea, edema.  GASTROINTESTINAL: No nausea, vomiting, diarrhea or abdominal pain.  GENITOURINARY: No dysuria, hematuria.  ENDOCRINE: No polyuria, nocturia,  HEMATOLOGY: No anemia, easy bruising or bleeding SKIN: No rash or lesion. MUSCULOSKELETAL: No joint pain or arthritis.   NEUROLOGIC: No tingling, numbness, weakness.  PSYCHIATRY: No anxiety or depression.   ROS  DRUG ALLERGIES:   Allergies  Allergen Reactions  . Sulfa Antibiotics Swelling    angioedema  . Eggs Or Egg-Derived Products Hives and Nausea Only  . Lincocin [Lincomycin Hcl] Hives    VITALS:  Blood pressure 133/75, pulse 87, temperature 98.3 F (36.8 C), temperature source Oral, resp. rate (!) 22, height 5\' 4"  (1.626 m), weight 62.2 kg (137 lb 2 oz), SpO2 96 %.  PHYSICAL EXAMINATION:  GENERAL:  57 y.o.-year-old patient lying in the bed with no acute distress.  EYES: Pupils equal, round, reactive to light and accommodation. No scleral icterus. Extraocular muscles intact.  HEENT: Head atraumatic, normocephalic. Oropharynx and nasopharynx clear.  NECK:  Supple, no jugular venous distention. No thyroid enlargement, no tenderness.  LUNGS: Normal breath sounds bilaterally, no wheezing, rales,rhonchi or crepitation. No use of accessory muscles of respiration.  CARDIOVASCULAR: S1, S2 normal. No murmurs, rubs, or gallops.  ABDOMEN: Soft, nontender, nondistended. Bowel sounds  present. No organomegaly or mass.  EXTREMITIES: No pedal edema, cyanosis, or clubbing.  NEUROLOGIC: Cranial nerves II through XII are intact. Muscle strength 5/5 in all extremities. Sensation intact. Gait not checked.  PSYCHIATRIC: The patient is alert and oriented x 3.  SKIN: No obvious rash, lesion, or ulcer.   Physical Exam LABORATORY PANEL:   CBC Recent Labs  Lab 05/04/17 0607  WBC 7.6  HGB 11.1*  HCT 37.2  PLT 160   ------------------------------------------------------------------------------------------------------------------  Chemistries  Recent Labs  Lab 05/03/17 1100 05/04/17 0248  NA 141  --   K 4.4  --   CL 81*  --   CO2 48*  --   GLUCOSE 118*  --   BUN 29*  --   CREATININE 0.62  --   CALCIUM 9.2  --   MG  --  2.4  AST 27  --   ALT 10*  --   ALKPHOS 64  --   BILITOT 0.8  --    ------------------------------------------------------------------------------------------------------------------  Cardiac Enzymes Recent Labs  Lab 05/03/17 2039 05/04/17 0248  TROPONINI 0.26* 0.30*   ------------------------------------------------------------------------------------------------------------------  RADIOLOGY:  No results found.  ASSESSMENT AND PLAN:  1 Acute on chronic respiratory failure with hypoxia, hypercapnia Treated initially with BiPAP,followed by hospice previously, made DNR/comfort care with plans for home hospice later today.  2 end-stage COPD with hypercapnia and hypoxia Plan of care as stated above  All the records are reviewed and case discussed with Care Management/Social Workerr. Management plans discussed with the patient, family and they are in agreement.  CODE STATUS: dnr/cc  TOTAL TIME TAKING CARE OF THIS PATIENT: 35 minutes.  POSSIBLE D/C IN 0 DAYS, DEPENDING ON CLINICAL CONDITION.   Avel Peace Koren Sermersheim M.D on 05/07/2017   Between 7am to 6pm - Pager - [phone]  After 6pm go to [email or web address] - password [email or web address]  [facility]  Office  [phone]  CC: Primary care physician; Mikey College, NP  Note: This dictation was prepared with Dragon dictation along with smaller phrase technology. Any transcriptional errors that result from this process are unintentional.

## 2017-05-10 LAB — BLOOD GAS, ARTERIAL
DELIVERY SYSTEMS: POSITIVE
Delivery systems: POSITIVE
EXPIRATORY PAP: 5
FIO2: 1
FIO2: 0.8
Inspiratory PAP: 20
Mechanical Rate: 15
PCO2 ART: 120 mmHg — AB (ref 32.0–48.0)
PH ART: 6.93 — AB (ref 7.350–7.450)
PO2 ART: 70 mmHg — AB (ref 83.0–108.0)
PO2 ART: 60 mmHg — AB (ref 83.0–108.0)
Patient temperature: 37
Patient temperature: 37
RATE: 15 resp/min
pCO2 arterial: 120 mmHg (ref 32.0–48.0)
pH, Arterial: 7.34 — ABNORMAL LOW (ref 7.350–7.450)

## 2017-05-10 LAB — BLOOD GAS, VENOUS
DELIVERY SYSTEMS: POSITIVE
FIO2: 1
PATIENT TEMPERATURE: 37
PH VEN: 7.13 — AB (ref 7.250–7.430)
pCO2, Ven: 120 mmHg (ref 44.0–60.0)

## 2017-05-18 ENCOUNTER — Telehealth: Payer: Self-pay | Admitting: Internal Medicine

## 2017-05-18 NOTE — Telephone Encounter (Signed)
SIGNED CMN MAILED TO [facility].JW

## 2017-06-14 ENCOUNTER — Ambulatory Visit: Payer: Medicaid Other | Admitting: Nurse Practitioner

## 2017-07-19 ENCOUNTER — Telehealth: Payer: Self-pay | Admitting: Nurse Practitioner

## 2017-07-19 NOTE — Telephone Encounter (Signed)
Leann with Hospice said pt expired.  Her call back number is [phone]

## 2017-07-19 NOTE — Telephone Encounter (Signed)
No additional information needed from [place].  Pt has been marked as deceased for pt status.

## 2017-08-11 DEATH — deceased

## 2017-09-28 ENCOUNTER — Ambulatory Visit: Payer: Self-pay | Admitting: Internal Medicine
